# Patient Record
Sex: Female | Born: 1995 | Race: Black or African American | Hispanic: No | Marital: Single | Smoking: Never smoker
Health system: Southern US, Community
[De-identification: ages and names within clinical notes are randomized; demographics above are authoritative.]

## PROBLEM LIST (undated history)

## (undated) ENCOUNTER — Inpatient Hospital Stay (HOSPITAL_COMMUNITY): Payer: Self-pay

## (undated) DIAGNOSIS — Z789 Other specified health status: Secondary | ICD-10-CM

## (undated) HISTORY — DX: Other specified health status: Z78.9

## (undated) HISTORY — PX: NO PAST SURGERIES: SHX2092

---
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
## 2016-06-01 ENCOUNTER — Encounter (HOSPITAL_COMMUNITY): Payer: Self-pay

## 2016-06-01 ENCOUNTER — Ambulatory Visit (HOSPITAL_COMMUNITY)
Admission: EM | Admit: 2016-06-01 | Discharge: 2016-06-01 | Disposition: A | Discharge status: Home / Self Care | Payer: Medicaid Other | Attending: Internal Medicine | Admitting: Internal Medicine

## 2016-06-01 DIAGNOSIS — B373 Candidiasis of vulva and vagina: Secondary | ICD-10-CM | POA: Diagnosis present

## 2016-06-01 DIAGNOSIS — Z888 Allergy status to other drugs, medicaments and biological substances status: Secondary | ICD-10-CM | POA: Insufficient documentation

## 2016-06-01 DIAGNOSIS — B3731 Acute candidiasis of vulva and vagina: Secondary | ICD-10-CM

## 2016-06-01 DIAGNOSIS — Z880 Allergy status to penicillin: Secondary | ICD-10-CM | POA: Insufficient documentation

## 2016-06-01 LAB — POCT URINALYSIS DIP (DEVICE)
BILIRUBIN URINE: NEGATIVE
Glucose, UA: NEGATIVE mg/dL
Hgb urine dipstick: NEGATIVE
Ketones, ur: NEGATIVE mg/dL
Leukocytes, UA: NEGATIVE
NITRITE: NEGATIVE
PH: 6.5 (ref 5.0–8.0)
PROTEIN: NEGATIVE mg/dL
Specific Gravity, Urine: 1.02 (ref 1.005–1.030)
UROBILINOGEN UA: 0.2 mg/dL (ref 0.0–1.0)

## 2016-06-01 MED ORDER — FLUCONAZOLE 150 MG PO TABS: 150.0000 mg | ORAL_TABLET | Freq: Every day | ORAL | 0 refills | Status: DC

## 2016-06-01 NOTE — ED Triage Notes (Signed)
Patient presents with possible yeast infect since 05/27/2016. Patient is experiencing white clumpy discharge and itching.

## 2016-06-01 NOTE — ED Provider Notes (Signed)
CSN: [id number]     Arrival date & time 06/01/16  1540 History   None    Chief Complaint  Patient presents with  . Vaginitis   (Consider location/radiation/quality/duration/timing/severity/associated sxs/prior Treatment) 20 y.o. female presents with vaginal discharge X 3 days. Patient describes it as thik and 'curdlike" with irritation at uvula.Patient is sexually active and states that she hd intercourse on Saturday and her partner used a new brand of condom.  Condition is acute in nature. Condition is made better by nothing. Condition is made worse by nothing. Patient denies any tratment prior to there arrival at this facility. Patient denies any pain with urination, nausea, vomiting or fevers.        History reviewed. No pertinent past medical history. History reviewed. No pertinent surgical history. History reviewed. No pertinent family history. Social History  Substance Use Topics  . Smoking status: Never Smoker  . Smokeless tobacco: Never Used  . Alcohol use No   OB History    No data available     Review of Systems  Allergies  Aspirin; Penicillins; Betadine [povidone iodine]; and Iodine  Home Medications   Prior to Admission medications   Medication Sig Start Date End Date Taking? Authorizing Provider  fluconazole (DIFLUCAN) 150 MG tablet Take 1 tablet (150 mg total) by mouth daily. 06/01/16   Alene MiresJennifer C Amonda Brillhart, NP  metroNIDAZOLE (FLAGYL) 500 MG tablet Take 1 tablet (500 mg total) by mouth 2 (two) times daily. X 7 days 11/24/15   Domenick GongAshley Mortenson, MD   Meds Ordered and Administered this Visit  Medications - No data to display  BP 121/68 (BP Location: Left Arm)   Pulse 76   Temp 98.5 F (36.9 C) (Oral)   Resp 12   LMP 05/09/2016 (Exact Date)   SpO2 100%  No data found.   Physical Exam  Urgent Care Course   Clinical Course    Procedures (including critical care time)  Labs Review Labs Reviewed  POCT URINALYSIS DIP (DEVICE)  URINE CYTOLOGY  ANCILLARY ONLY    Imaging Review No results found.   Visual Acuity Review  Right Eye Distance:   Left Eye Distance:   Bilateral Distance:    Right Eye Near:   Left Eye Near:    Bilateral Near:         MDM   1. Candida vaginitis        Alene MiresJennifer C Clovis Mankins, NP 06/01/16 1636

## 2016-06-03 LAB — URINE CYTOLOGY ANCILLARY ONLY
CHLAMYDIA, DNA PROBE: NEGATIVE
Neisseria Gonorrhea: NEGATIVE
TRICH (WINDOWPATH): NEGATIVE

## 2016-06-05 LAB — URINE CYTOLOGY ANCILLARY ONLY
BACTERIAL VAGINITIS: NEGATIVE
Candida vaginitis: NEGATIVE

## 2016-06-30 ENCOUNTER — Ambulatory Visit (HOSPITAL_COMMUNITY)
Admission: EM | Admit: 2016-06-30 | Discharge: 2016-06-30 | Disposition: A | Discharge status: Home / Self Care | Payer: Medicaid Other | Attending: Family Medicine | Admitting: Family Medicine

## 2016-06-30 ENCOUNTER — Encounter (HOSPITAL_COMMUNITY): Payer: Self-pay | Admitting: *Deleted

## 2016-06-30 DIAGNOSIS — Z880 Allergy status to penicillin: Secondary | ICD-10-CM | POA: Insufficient documentation

## 2016-06-30 DIAGNOSIS — N760 Acute vaginitis: Secondary | ICD-10-CM | POA: Diagnosis not present

## 2016-06-30 DIAGNOSIS — Z79899 Other long term (current) drug therapy: Secondary | ICD-10-CM | POA: Insufficient documentation

## 2016-06-30 DIAGNOSIS — Z202 Contact with and (suspected) exposure to infections with a predominantly sexual mode of transmission: Secondary | ICD-10-CM | POA: Diagnosis not present

## 2016-06-30 LAB — POCT URINALYSIS DIP (DEVICE)
BILIRUBIN URINE: NEGATIVE
GLUCOSE, UA: NEGATIVE mg/dL
Hgb urine dipstick: NEGATIVE
Ketones, ur: 15 mg/dL — AB
LEUKOCYTES UA: NEGATIVE
NITRITE: NEGATIVE
Protein, ur: NEGATIVE mg/dL
Specific Gravity, Urine: 1.02 (ref 1.005–1.030)
Urobilinogen, UA: 0.2 mg/dL (ref 0.0–1.0)
pH: 6.5 (ref 5.0–8.0)

## 2016-06-30 LAB — POCT PREGNANCY, URINE: PREG TEST UR: NEGATIVE

## 2016-06-30 NOTE — ED Triage Notes (Signed)
C/O intermittent abd cramping, appetite changes; pt has implant birth control, but still wishes to have preg test.  Also c/o slight vaginal discharge.  Wishes to be checked for STDs.  No known exposure.

## 2016-06-30 NOTE — Discharge Instructions (Signed)
It was nice seeing you today. We have tested you for STDs. We will contact you with result soon. Call if you have any concern. Please use condom regularly to prevent STDs

## 2016-06-30 NOTE — ED Provider Notes (Signed)
[facility]    CSN: [id number] Arrival date & time: 06/30/16  1545     History   Chief Complaint Chief Complaint  Patient presents with  . Unprotected Sex    HPI Jennifer Sanchez is a 20 y.o. female.   The history is provided by the patient. No language interpreter was used.  Vaginal Discharge  Associated symptoms: no dysuria   She was recently seen here for yeast infection which got treated. She has been sexually active with this new partner with a change in condom. She feels the discharge may be due to the condom but she just wanted to check. Denies dysuria.  History reviewed. No pertinent past medical history.  There are no active problems to display for this patient.   History reviewed. No pertinent surgical history.  OB History    No data available       Home Medications    Prior to Admission medications   Medication Sig Start Date End Date Taking? Authorizing Provider  Etonogestrel (IMPLANON [place]) Inject into the skin.   Yes Historical Provider, MD  fluconazole (DIFLUCAN) 150 MG tablet Take 1 tablet (150 mg total) by mouth daily. 06/01/16   Alene MiresJennifer C Omohundro, NP  metroNIDAZOLE (FLAGYL) 500 MG tablet Take 1 tablet (500 mg total) by mouth 2 (two) times daily. X 7 days 11/24/15   Domenick GongAshley Mortenson, MD    Family History No family history on file.  Social History Social History  Substance Use Topics  . Smoking status: Never Smoker  . Smokeless tobacco: Never Used  . Alcohol use No     Allergies   Aspirin; Penicillins; Betadine [povidone iodine]; and Iodine   Review of Systems Review of Systems  Respiratory: Negative.   Cardiovascular: Negative.   Genitourinary: Positive for vaginal discharge. Negative for difficulty urinating, dysuria, flank pain and menstrual problem.  All other systems reviewed and are negative.    Physical Exam Triage Vital Signs ED Triage Vitals [06/30/16 1640]  Enc Vitals Group     BP 123/76     Pulse Rate  71     Resp 12     Temp 98 F (36.7 C)     Temp Source Oral     SpO2 100 %     Weight      Height      Head Circumference      Peak Flow      Pain Score      Pain Loc      Pain Edu?      Excl. in GC?    No data found.   Updated Vital Signs BP 123/76 (BP Location: Left Arm)   Pulse 71   Temp 98 F (36.7 C) (Oral)   Resp 12   LMP 06/08/2016 (Exact Date)   SpO2 100%   Visual Acuity Right Eye Distance:   Left Eye Distance:   Bilateral Distance:    Right Eye Near:   Left Eye Near:    Bilateral Near:     Physical Exam  Constitutional: She appears well-developed and well-nourished. No distress.  Cardiovascular: Normal rate, regular rhythm and normal heart sounds.  Exam reveals no friction rub.   No murmur heard. Pulmonary/Chest: Effort normal and breath sounds normal. No respiratory distress. She has no wheezes.  Abdominal: Soft. She exhibits no distension. There is no tenderness. There is no guarding.  Genitourinary: Pelvic exam was performed with patient supine. There is no tenderness on the right labia. There is  no tenderness on the left labia. Cervix exhibits no motion tenderness and no friability. Right adnexum displays no mass and no tenderness. Left adnexum displays no mass and no tenderness. Vaginal discharge found.  Nursing note and vitals reviewed.    UC Treatments / Results  Labs (all labs ordered are listed, but only abnormal results are displayed) Labs Reviewed - No data to display  EKG  EKG Interpretation None       Radiology No results found.  Procedures Procedures (including critical care time)  Medications Ordered in UC Medications - No data to display   Initial Impression / Assessment and Plan / UC Course  I have reviewed the triage vital signs and the nursing notes.  Pertinent labs & imaging results that were available during my care of the patient were reviewed by me and considered in my medical decision making (see chart for  details).  Clinical Course as of Jun 30 1718  Wynelle LinkSun Jun 30, 2016  1717 Wet prep and STD testing done today. GU with very minimal discharge which looks normal. Will defer treatment pending test result. STD prevention handout given. Return precaution discussed.  Note Upreg neg today and UA is normal.  [KE]    Clinical Course User Index [KE] Doreene ElandKehinde T Halayna Blane, MD     Final Clinical Impressions(s) / UC Diagnoses   Final diagnoses:  None   Acute vaginitis  Possible exposure to STD  New Prescriptions New Prescriptions   No medications on file     Doreene ElandKehinde T Evonte Prestage, MD 06/30/16 1719

## 2016-07-01 ENCOUNTER — Telehealth: Payer: Self-pay | Admitting: Family Medicine

## 2016-07-01 LAB — CERVICOVAGINAL ANCILLARY ONLY
CHLAMYDIA, DNA PROBE: NEGATIVE
Neisseria Gonorrhea: NEGATIVE

## 2016-07-01 LAB — HIV ANTIBODY (ROUTINE TESTING W REFLEX): HIV SCREEN 4TH GENERATION: NONREACTIVE

## 2016-07-01 LAB — RPR: RPR: NONREACTIVE

## 2016-07-01 NOTE — Telephone Encounter (Signed)
Message left to call about result.  Wet prep still pending.

## 2016-07-02 LAB — CERVICOVAGINAL ANCILLARY ONLY: WET PREP (BD AFFIRM): NEGATIVE

## 2016-09-13 ENCOUNTER — Ambulatory Visit (HOSPITAL_COMMUNITY)
Admission: EM | Admit: 2016-09-13 | Discharge: 2016-09-13 | Disposition: A | Discharge status: Home / Self Care | Payer: Medicaid Other | Attending: Family Medicine | Admitting: Family Medicine

## 2016-09-13 ENCOUNTER — Encounter (HOSPITAL_COMMUNITY): Payer: Self-pay | Admitting: *Deleted

## 2016-09-13 DIAGNOSIS — Z888 Allergy status to other drugs, medicaments and biological substances status: Secondary | ICD-10-CM | POA: Insufficient documentation

## 2016-09-13 DIAGNOSIS — B9689 Other specified bacterial agents as the cause of diseases classified elsewhere: Secondary | ICD-10-CM

## 2016-09-13 DIAGNOSIS — N760 Acute vaginitis: Secondary | ICD-10-CM | POA: Insufficient documentation

## 2016-09-13 DIAGNOSIS — Z880 Allergy status to penicillin: Secondary | ICD-10-CM | POA: Diagnosis not present

## 2016-09-13 DIAGNOSIS — Z79899 Other long term (current) drug therapy: Secondary | ICD-10-CM | POA: Insufficient documentation

## 2016-09-13 DIAGNOSIS — L292 Pruritus vulvae: Secondary | ICD-10-CM | POA: Insufficient documentation

## 2016-09-13 MED ORDER — METRONIDAZOLE 500 MG PO TABS: 500.0000 mg | ORAL_TABLET | Freq: Two times a day (BID) | ORAL | 0 refills | Status: DC

## 2016-09-13 NOTE — Discharge Instructions (Signed)
You are being treated today for bacterial vaginosis. Urine has been collected and will be tested for BV, Yeast, and various other causes of infection. Should any change in therapy be needed, you will be contacted. I have sent a prescription to your pharmacy for Metronidazole. Take 1 tablet twice a day for 7 days. Do not drink alcohol while taking this medicine as it will make you ill. Should your symptoms fail to improve, follow up with your primary care provider or return to clinic.

## 2016-09-13 NOTE — ED Triage Notes (Signed)
PT  REPORTS  VAGINAL ITCHING  WITH  SLIGHT  DISCHARGE  X  3-4   DAYS

## 2016-09-13 NOTE — ED Provider Notes (Signed)
CSN: [id number]     Arrival date & time 09/13/16  1511 History   First MD Initiated Contact with Patient 09/13/16 1633     Chief Complaint  Patient presents with  . Vaginal Itching   (Consider location/radiation/quality/duration/timing/severity/associated sxs/prior Treatment) 21 year old female presents with chief complaint of vaginal discharge and itching. She describes the discharge as white, smooth in texture, and having foul odor. She has prior history of BV and states her current symptoms are consistent with prior infections. She denies being sexually active since her last screening.   The history is provided by the patient.  Vaginal Itching     History reviewed. No pertinent past medical history. History reviewed. No pertinent surgical history. History reviewed. No pertinent family history. Social History  Substance Use Topics  . Smoking status: Never Smoker  . Smokeless tobacco: Never Used  . Alcohol use No   OB History    No data available     Review of Systems  Reason unable to perform ROS: as covered in HPI.  All other systems reviewed and are negative.   Allergies  Aspirin; Penicillins; Betadine [povidone iodine]; and Iodine  Home Medications   Prior to Admission medications   Medication Sig Start Date End Date Taking? Authorizing Provider  Etonogestrel (IMPLANON ) Inject into the skin.    Historical Provider, MD  fluconazole (DIFLUCAN) 150 MG tablet Take 1 tablet (150 mg total) by mouth daily. 06/01/16   Alene MiresJennifer C Omohundro, NP  metroNIDAZOLE (FLAGYL) 500 MG tablet Take 1 tablet (500 mg total) by mouth 2 (two) times daily. X 7 days 11/24/15   Domenick GongAshley Mortenson, MD  metroNIDAZOLE (FLAGYL) 500 MG tablet Take 1 tablet (500 mg total) by mouth 2 (two) times daily. 09/13/16   Dorena BodoLawrence Leea Rambeau, NP   Meds Ordered and Administered this Visit  Medications - No data to display  LMP 09/08/2016  No data found.   Physical Exam  Constitutional: She is oriented to  person, place, and time. She appears well-developed and well-nourished. No distress.  HENT:  Head: Normocephalic and atraumatic.  Genitourinary:  Genitourinary Comments: Deferred   Neurological: She is alert and oriented to person, place, and time.  Skin: Skin is warm and dry. Capillary refill takes less than 2 seconds. She is not diaphoretic.  Psychiatric: She has a normal mood and affect.  Vitals reviewed.   Urgent Care Course     Procedures (including critical care time)  Labs Review Labs Reviewed  URINE CYTOLOGY ANCILLARY ONLY    Imaging Review No results found.   Visual Acuity Review  Right Eye Distance:   Left Eye Distance:   Bilateral Distance:    Right Eye Near:   Left Eye Near:    Bilateral Near:         MDM   1. BV (bacterial vaginosis)   You are being treated today for bacterial vaginosis. Urine has been collected and will be tested for BV, Yeast, and various other causes of infection. Should any change in therapy be needed, you will be contacted. I have sent a prescription to your pharmacy for Metronidazole. Take 1 tablet twice a day for 7 days. Do not drink alcohol while taking this medicine as it will make you ill. Should your symptoms fail to improve, follow up with your primary care provider or return to clinic.      Dorena BodoLawrence Yani Lal, NP 09/13/16 1646

## 2016-09-17 LAB — URINE CYTOLOGY ANCILLARY ONLY
Chlamydia: NEGATIVE
NEISSERIA GONORRHEA: NEGATIVE
TRICH (WINDOWPATH): NEGATIVE

## 2016-09-19 LAB — URINE CYTOLOGY ANCILLARY ONLY: Candida vaginitis: NEGATIVE

## 2016-09-24 ENCOUNTER — Telehealth (HOSPITAL_COMMUNITY): Payer: Self-pay | Admitting: Emergency Medicine

## 2016-09-24 MED ORDER — FLUCONAZOLE 150 MG PO TABS: 150.0000 mg | ORAL_TABLET | Freq: Every day | ORAL | 0 refills | Status: DC

## 2016-09-24 NOTE — Telephone Encounter (Signed)
Pt called needing Rx for Diflucan after taking Flagyl  Updated new phone # in EPIC  Per Harriet PhoLawrence K, NP... Ok to call in Diflucan 150 mg and repeat in 72 hours if no improvement.   Per pt's request... [facility]  Also gave results from 09/13/16 (Chlam/Gon/Trich/BV)  Pt verb understanding.

## 2018-01-21 ENCOUNTER — Emergency Department (HOSPITAL_COMMUNITY): Payer: Self-pay

## 2018-01-21 ENCOUNTER — Emergency Department (HOSPITAL_COMMUNITY)
Admission: EM | Admit: 2018-01-21 | Discharge: 2018-01-21 | Disposition: A | Discharge status: Home / Self Care | Payer: Self-pay | Attending: Emergency Medicine | Admitting: Emergency Medicine

## 2018-01-21 ENCOUNTER — Encounter (HOSPITAL_COMMUNITY): Payer: Self-pay | Admitting: Emergency Medicine

## 2018-01-21 DIAGNOSIS — R0789 Other chest pain: Secondary | ICD-10-CM

## 2018-01-21 DIAGNOSIS — J069 Acute upper respiratory infection, unspecified: Secondary | ICD-10-CM | POA: Insufficient documentation

## 2018-01-21 DIAGNOSIS — B9789 Other viral agents as the cause of diseases classified elsewhere: Secondary | ICD-10-CM

## 2018-01-21 DIAGNOSIS — Z79899 Other long term (current) drug therapy: Secondary | ICD-10-CM | POA: Insufficient documentation

## 2018-01-21 LAB — I-STAT BETA HCG BLOOD, ED (MC, WL, AP ONLY): I-stat hCG, quantitative: <5 m[IU]/mL (ref <5)

## 2018-01-21 LAB — CBC
HEMATOCRIT: 40.4 % (ref 36.0–46.0)
HEMOGLOBIN: 13.1 g/dL (ref 12.0–15.0)
MCH: 31 pg (ref 26.0–34.0)
MCHC: 32.4 g/dL (ref 30.0–36.0)
MCV: 95.7 fL (ref 78.0–100.0)
Platelets: 280 10*3/uL (ref 150–400)
RBC: 4.22 MIL/uL (ref 3.87–5.11)
RDW: 12.4 % (ref 11.5–15.5)
WBC: 5.7 10*3/uL (ref 4.0–10.5)

## 2018-01-21 LAB — I-STAT TROPONIN, ED: Troponin i, poc: 0 ng/mL (ref 0.00–0.08)

## 2018-01-21 LAB — BASIC METABOLIC PANEL
ANION GAP: 8 (ref 5–15)
BUN: 12 mg/dL (ref 6–20)
CHLORIDE: 107 mmol/L (ref 101–111)
CO2: 25 mmol/L (ref 22–32)
Calcium: 8.8 mg/dL — ABNORMAL LOW (ref 8.9–10.3)
Creatinine, Ser: 0.7 mg/dL (ref 0.44–1.00)
GFR calc Af Amer: >60 mL/min (ref >60)
GLUCOSE: 127 mg/dL — AB (ref 65–99)
POTASSIUM: 3.6 mmol/L (ref 3.5–5.1)
Sodium: 140 mmol/L (ref 135–145)

## 2018-01-21 MED ORDER — GUAIFENESIN-CODEINE 100-10 MG/5ML PO SOLN: 10.0000 mL | Freq: Four times a day (QID) | ORAL | 0 refills | Status: DC | PRN

## 2018-01-21 NOTE — Discharge Instructions (Addendum)
Ibuprofen 600 mg every 6 hours as needed for pain.  Robitussin with codeine as prescribed as needed for cough.  Follow-up with your primary doctor if symptoms are not improving in the next week.

## 2018-01-21 NOTE — ED Triage Notes (Signed)
Patient presents with multiple complaints : central chest pain with dry cough , nasal congestion/sinus pressure and left temporal headache .

## 2018-01-21 NOTE — ED Provider Notes (Signed)
[facility] EMERGENCY DEPARTMENT Provider Note   CSN: [id number] Arrival date & time: 01/21/18  0007     History   Chief Complaint Chief Complaint  Patient presents with  . Chest Pain    HPI Jennifer Sanchez is a 22 y.o. female.  Patient is a 22 year old female with no significant past medical history.  She presents with a 3-day history of nasal congestion, cough, and chest discomfort.  Her cough is been nonproductive.  She denies any fevers or chills.  She denies any ill contacts.  The history is provided by the patient.  Chest Pain   This is a new problem. Episode onset: 3 days ago. The problem occurs constantly. The problem has been gradually worsening. The pain is associated with coughing. The pain is present in the substernal region. The pain is moderate. The quality of the pain is described as sharp. The pain does not radiate.    History reviewed. No pertinent past medical history.  There are no active problems to display for this patient.   History reviewed. No pertinent surgical history.   OB History   None      Home Medications    Prior to Admission medications   Medication Sig Start Date End Date Taking? Authorizing Provider  norgestimate-ethinyl estradiol (ORTHO-CYCLEN,SPRINTEC,PREVIFEM) 0.25-35 MG-MCG tablet Take 1 tablet by mouth daily.   Yes [provider]  fluconazole (DIFLUCAN) 150 MG tablet Take 1 tablet (150 mg total) by mouth daily. Patient not taking: Reported on 01/21/2018 06/01/16   Alene Mires, NP  fluconazole (DIFLUCAN) 150 MG tablet Take 1 tablet (150 mg total) by mouth daily. Repeat in 72 hours if not improving. Patient not taking: Reported on 01/21/2018 09/24/16   Dorena Bodo, NP  metroNIDAZOLE (FLAGYL) 500 MG tablet Take 1 tablet (500 mg total) by mouth 2 (two) times daily. X 7 days Patient not taking: Reported on 01/21/2018 11/24/15   Domenick Gong, MD  metroNIDAZOLE (FLAGYL) 500 MG tablet Take 1  tablet (500 mg total) by mouth 2 (two) times daily. Patient not taking: Reported on 01/21/2018 09/13/16   Dorena Bodo, NP    Family History No family history on file.  Social History Social History   Tobacco Use  . Smoking status: Never Smoker  . Smokeless tobacco: Never Used  Substance Use Topics  . Alcohol use: No  . Drug use: No     Allergies   Aspirin; Penicillins; Betadine [povidone iodine]; and Iodine   Review of Systems Review of Systems  Cardiovascular: Positive for chest pain.  All other systems reviewed and are negative.    Physical Exam Updated Vital Signs BP 116/80 (BP Location: Left Arm)   Pulse 74   Temp 98.2 F (36.8 C) (Oral)   Resp 18   Ht 5\' 7"  (1.702 m)   Wt 68.5 kg (151 lb)   LMP 01/14/2018 (Approximate)   SpO2 100%   BMI 23.65 kg/m   Physical Exam  Constitutional: She is oriented to person, place, and time. She appears well-developed and well-nourished. No distress.  HENT:  Head: Normocephalic and atraumatic.  Neck: Normal range of motion. Neck supple.  Cardiovascular: Normal rate and regular rhythm. Exam reveals no gallop and no friction rub.  No murmur heard. Pulmonary/Chest: Effort normal and breath sounds normal. No respiratory distress. She has no wheezes.  Abdominal: Soft. Bowel sounds are normal. She exhibits no distension. There is no tenderness.  Musculoskeletal: Normal range of motion.  Neurological: She is alert and  oriented to person, place, and time.  Skin: Skin is warm and dry. She is not diaphoretic.  Nursing note and vitals reviewed.    ED Treatments / Results  Labs (all labs ordered are listed, but only abnormal results are displayed) Labs Reviewed  BASIC METABOLIC PANEL - Abnormal; Notable for the following components:      Result Value   Glucose, Bld 127 (*)    Calcium 8.8 (*)    All other components within normal limits  CBC  I-STAT TROPONIN, ED  I-STAT BETA HCG BLOOD, ED ([facility], [facility], [facility] ONLY)     EKG EKG Interpretation  Date/Time:  Wednesday January 21 2018 00:27:41 EDT Ventricular Rate:  90 PR Interval:  152 QRS Duration: 82 QT Interval:  338 QTC Calculation: 413 R Axis:   77 Text Interpretation:  Normal sinus rhythm with sinus arrhythmia Otherwise within normal limits No old tracing to compare Confirmed by Dione BoozeGlick, David ([id number]) on 01/21/2018 12:32:24 AM   Radiology Dg Chest 2 View  Result Date: 01/21/2018 CLINICAL DATA:  Acute onset of mid chest pain. EXAM: CHEST - 2 VIEW COMPARISON:  None. FINDINGS: The lungs are well-aerated and clear. There is no evidence of focal opacification, pleural effusion or pneumothorax. The heart is normal in size; the mediastinal contour is within normal limits. No acute osseous abnormalities are seen. Bilateral metallic nipple piercings are noted. IMPRESSION: No acute cardiopulmonary process seen. Electronically Signed   By: Roanna RaiderJeffery  Chang M.D.   On: 01/21/2018 01:14    Procedures Procedures (including critical care time)  Medications Ordered in ED Medications - No data to display   Initial Impression / Assessment and Plan / ED Course  I have reviewed the triage vital signs and the nursing notes.  Pertinent labs & imaging results that were available during my care of the patient were reviewed by me and considered in my medical decision making (see chart for details).  Patient symptoms are most likely viral in nature.  She is having some chest wall discomfort, likely from coughing.  Nothing in the work-up suggest a cardiac etiology and I highly doubt PE.  I will recommend over-the-counter anti-inflammatory medicines and prescribe Robitussin with codeine which she can take as needed for her cough.  Final Clinical Impressions(s) / ED Diagnoses   Final diagnoses:  None    ED Discharge Orders    None       Geoffery Lyonselo, Anja Neuzil, MD 01/21/18 [phone]

## 2018-06-16 ENCOUNTER — Emergency Department (HOSPITAL_COMMUNITY)
Admission: EM | Admit: 2018-06-16 | Discharge: 2018-06-16 | Disposition: A | Discharge status: Home / Self Care | Payer: Medicaid Other | Attending: Emergency Medicine | Admitting: Emergency Medicine

## 2018-06-16 ENCOUNTER — Other Ambulatory Visit: Payer: Self-pay

## 2018-06-16 ENCOUNTER — Emergency Department (HOSPITAL_COMMUNITY): Payer: Medicaid Other

## 2018-06-16 ENCOUNTER — Encounter (HOSPITAL_COMMUNITY): Payer: Self-pay | Admitting: Emergency Medicine

## 2018-06-16 DIAGNOSIS — Z3A01 Less than 8 weeks gestation of pregnancy: Secondary | ICD-10-CM | POA: Insufficient documentation

## 2018-06-16 DIAGNOSIS — O209 Hemorrhage in early pregnancy, unspecified: Secondary | ICD-10-CM | POA: Diagnosis not present

## 2018-06-16 DIAGNOSIS — O9989 Other specified diseases and conditions complicating pregnancy, childbirth and the puerperium: Secondary | ICD-10-CM | POA: Insufficient documentation

## 2018-06-16 DIAGNOSIS — O4690 Antepartum hemorrhage, unspecified, unspecified trimester: Secondary | ICD-10-CM

## 2018-06-16 DIAGNOSIS — R102 Pelvic and perineal pain: Secondary | ICD-10-CM | POA: Diagnosis not present

## 2018-06-16 LAB — CBC WITH DIFFERENTIAL/PLATELET
Abs Immature Granulocytes: 0.03 10*3/uL (ref 0.00–0.07)
Basophils Absolute: 0 10*3/uL (ref 0.0–0.1)
Basophils Relative: 0 %
Eosinophils Absolute: 0.1 10*3/uL (ref 0.0–0.5)
Eosinophils Relative: 2 %
HCT: 36.7 % (ref 36.0–46.0)
Hemoglobin: 11.9 g/dL — ABNORMAL LOW (ref 12.0–15.0)
Immature Granulocytes: 0 %
Lymphocytes Relative: 31 %
Lymphs Abs: 2.3 10*3/uL (ref 0.7–4.0)
MCH: 30.9 pg (ref 26.0–34.0)
MCHC: 32.4 g/dL (ref 30.0–36.0)
MCV: 95.3 fL (ref 80.0–100.0)
Monocytes Absolute: 0.6 10*3/uL (ref 0.1–1.0)
Monocytes Relative: 8 %
Neutro Abs: 4.3 10*3/uL (ref 1.7–7.7)
Neutrophils Relative %: 59 %
Platelets: 325 10*3/uL (ref 150–400)
RBC: 3.85 MIL/uL — ABNORMAL LOW (ref 3.87–5.11)
RDW: 12.5 % (ref 11.5–15.5)
WBC: 7.4 10*3/uL (ref 4.0–10.5)
nRBC: 0 % (ref 0.0–0.2)

## 2018-06-16 LAB — HCG, QUANTITATIVE, PREGNANCY: hCG, Beta Chain, Quant, S: 126030 m[IU]/mL — ABNORMAL HIGH (ref <5)

## 2018-06-16 LAB — BASIC METABOLIC PANEL
Anion gap: 11 (ref 5–15)
BUN: 8 mg/dL (ref 6–20)
CO2: 22 mmol/L (ref 22–32)
Calcium: 9 mg/dL (ref 8.9–10.3)
Chloride: 102 mmol/L (ref 98–111)
Creatinine, Ser: 0.64 mg/dL (ref 0.44–1.00)
GFR calc Af Amer: >60 mL/min (ref >60)
GFR calc non Af Amer: >60 mL/min (ref >60)
Glucose, Bld: 94 mg/dL (ref 70–99)
Potassium: 3.3 mmol/L — ABNORMAL LOW (ref 3.5–5.1)
Sodium: 135 mmol/L (ref 135–145)

## 2018-06-16 LAB — I-STAT BETA HCG BLOOD, ED (MC, WL, AP ONLY): I-stat hCG, quantitative: >2000 m[IU]/mL — ABNORMAL HIGH (ref <5)

## 2018-06-16 LAB — ABO/RH: ABO/RH(D): B POS

## 2018-06-16 LAB — WET PREP, GENITAL
Sperm: NONE SEEN
Trich, Wet Prep: NONE SEEN
Yeast Wet Prep HPF POC: NONE SEEN

## 2018-06-16 NOTE — ED Notes (Signed)
Results reviewed, no changes to acuity at this time 

## 2018-06-16 NOTE — ED Triage Notes (Signed)
Pt reports pelvic pain and vaginal bleeding/clot that started on Sunday. Pt reports his has since gone away with brown spotting this morning. Pain currently 2/10. Denies N/V/D. Pt reports her period was late, was supposed to be on 10/13 and only lasted these past 2 days. Pt takes birth control.

## 2018-06-16 NOTE — ED Notes (Signed)
Patient transported to [facility] 

## 2018-06-16 NOTE — Discharge Instructions (Addendum)
Please read the attached information's.  Please start taking prenatal vitamin with folic acid.  Please contact the women's outpatient clinic to schedule follow-up evaluation.  Return immediately if develop any new or concerning signs or symptoms.

## 2018-06-16 NOTE — ED Notes (Signed)
Pt returned from US

## 2018-06-16 NOTE — ED Provider Notes (Signed)
[name] EMERGENCY DEPARTMENT Provider Note   CSN: [id number] Arrival date & time: 06/16/18  1636     History   Chief Complaint Chief Complaint  Patient presents with  . Pelvic Pain  . Vaginal Bleeding    HPI Jennifer Sanchez is a 22 y.o. female.  HPI   22 year old G1, P0 A0 female presents today with complaints of vaginal bleeding and pelvic pain.  Patient notes that she is currently taking Sprintec for birth control.  She notes she has not missed any of her regularly scheduled medications over the last several months, she notes that her menstrual cycle is generally on the same time each month, her last menstrual cycle was on September 13 and she was expecting another one on October 13.  Patient notes that 2 days ago on October 27 she started having heavy bleeding as if she was going to have her menstrual cycle, this became very light the following day and now is having just a small amount of brown discharge.  Patient notes a very slight pain in her mid suprapubic region, nonsevere, she denies any abdominal pain nausea or vomiting.  Patient notes that after missing her period on the 13th she stopped taking her birth control pills.    History reviewed. No pertinent past medical history.  There are no active problems to display for this patient.   History reviewed. No pertinent surgical history.   OB History   None      Home Medications    Prior to Admission medications   Medication Sig Start Date End Date Taking? Authorizing Provider  fluconazole (DIFLUCAN) 150 MG tablet Take 1 tablet (150 mg total) by mouth daily. Patient not taking: Reported on 01/21/2018 06/01/16   Alene Mires, NP  fluconazole (DIFLUCAN) 150 MG tablet Take 1 tablet (150 mg total) by mouth daily. Repeat in 72 hours if not improving. Patient not taking: Reported on 01/21/2018 09/24/16   Dorena Bodo, NP  guaiFENesin-codeine 100-10 MG/5ML syrup Take 10 mLs by mouth every  6 (six) hours as needed for cough. 01/21/18   Geoffery Lyons, MD  metroNIDAZOLE (FLAGYL) 500 MG tablet Take 1 tablet (500 mg total) by mouth 2 (two) times daily. X 7 days Patient not taking: Reported on 01/21/2018 11/24/15   Domenick Gong, MD  metroNIDAZOLE (FLAGYL) 500 MG tablet Take 1 tablet (500 mg total) by mouth 2 (two) times daily. Patient not taking: Reported on 01/21/2018 09/13/16   Dorena Bodo, NP  norgestimate-ethinyl estradiol (ORTHO-CYCLEN,SPRINTEC,PREVIFEM) 0.25-35 MG-MCG tablet Take 1 tablet by mouth daily.    [provider]    Family History No family history on file.  Social History Social History   Tobacco Use  . Smoking status: Never Smoker  . Smokeless tobacco: Never Used  Substance Use Topics  . Alcohol use: No  . Drug use: No     Allergies   Aspirin; Penicillins; Betadine [povidone iodine]; Iodine; and Shellfish allergy   Review of Systems Review of Systems  All other systems reviewed and are negative.    Physical Exam Updated Vital Signs BP 124/73 (BP Location: Right Arm)   Pulse 87   Temp 99 F (37.2 C) (Oral)   Resp 16   Ht 5\' 6"  (1.676 m)   Wt 70.3 kg   LMP 06/16/2018   SpO2 100%   BMI 25.02 kg/m   Physical Exam  Constitutional: She is oriented to person, place, and time. She appears well-developed and well-nourished.  HENT:  Head:  Normocephalic and atraumatic.  Eyes: Pupils are equal, round, and reactive to light. Conjunctivae are normal. Right eye exhibits no discharge. Left eye exhibits no discharge. No scleral icterus.  Neck: Normal range of motion. No JVD present. No tracheal deviation present.  Pulmonary/Chest: Effort normal. No stridor.  Abdominal: Soft. She exhibits no distension. There is no tenderness.  Genitourinary:  Genitourinary Comments: Cervical loss is closed, no blood noted in the vaginal vault, slight irritation on the inferior cervix-no cervical motion tenderness  Neurological: She is alert and oriented to  person, place, and time. Coordination normal.  Psychiatric: She has a normal mood and affect. Her behavior is normal. Judgment and thought content normal.  Nursing note and vitals reviewed.    ED Treatments / Results  Labs (all labs ordered are listed, but only abnormal results are displayed) Labs Reviewed  WET PREP, GENITAL - Abnormal; Notable for the following components:      Result Value   Clue Cells Wet Prep HPF POC PRESENT (*)    WBC, Wet Prep HPF POC MANY (*)    All other components within normal limits  CBC WITH DIFFERENTIAL/PLATELET - Abnormal; Notable for the following components:   RBC 3.85 (*)    Hemoglobin 11.9 (*)    All other components within normal limits  BASIC METABOLIC PANEL - Abnormal; Notable for the following components:   Potassium 3.3 (*)    All other components within normal limits  HCG, QUANTITATIVE, PREGNANCY - Abnormal; Notable for the following components:   hCG, Beta Chain, Quant, S 126,030 (*)    All other components within normal limits  I-STAT BETA HCG BLOOD, ED ([facility], [facility], [facility] ONLY) - Abnormal; Notable for the following components:   I-stat hCG, quantitative >2,000.0 (*)    All other components within normal limits  ABO/RH  GC/CHLAMYDIA PROBE AMP ([place]) NOT AT [facility]    EKG None  Radiology US Ob Comp < 14 Wks  Result Date: 06/16/2018 CLINICAL DATA:  Vaginal bleeding. Estimated gestational age per LMP 6 weeks 4 days. Quantitative HCG greater than 2000. EXAM: OBSTETRIC <14 WK [place] AND TRANSVAGINAL OB US TECHNIQUE: Both transabdominal and transvaginal ultrasound examinations were performed for complete evaluation of the gestation as well as the maternal uterus, adnexal regions, and pelvic cul-de-sac. Transvaginal technique was performed to assess early pregnancy. COMPARISON:  None. FINDINGS: Intrauterine gestational sac: Single visualized. Single thin septation noted within the gestational sac. Yolk sac:  Visualized. Embryo:  Visualized. Cardiac  Activity: Visualized. Heart Rate: 119 bpm CRL: 6.2 mm   6 w   3 d                  [place] EDC: 02/06/2019 Subchorionic hemorrhage:  None visualized. Maternal uterus/adnexae: Ovaries are normal size, shape and position with normal color Doppler. No free pelvic fluid. IMPRESSION: Single live IUP with estimated gestational age [redacted] weeks 3 days. Electronically Signed   By: Elberta Fortis M.D.   On: 06/16/2018 21:44   US Ob Transvaginal  Result Date: 06/16/2018 CLINICAL DATA:  Vaginal bleeding. Estimated gestational age per LMP 6 weeks 4 days. Quantitative HCG greater than 2000. EXAM: OBSTETRIC <14 WK [place] AND TRANSVAGINAL OB US TECHNIQUE: Both transabdominal and transvaginal ultrasound examinations were performed for complete evaluation of the gestation as well as the maternal uterus, adnexal regions, and pelvic cul-de-sac. Transvaginal technique was performed to assess early pregnancy. COMPARISON:  None. FINDINGS: Intrauterine gestational sac: Single visualized. Single thin septation noted within the gestational sac. Yolk  sac:  Visualized. Embryo:  Visualized. Cardiac Activity: Visualized. Heart Rate: 119 bpm CRL: 6.2 mm   6 w   3 d                  [place] EDC: 02/06/2019 Subchorionic hemorrhage:  None visualized. Maternal uterus/adnexae: Ovaries are normal size, shape and position with normal color Doppler. No free pelvic fluid. IMPRESSION: Single live IUP with estimated gestational age [redacted] weeks 3 days. Electronically Signed   By: Elberta Fortis M.D.   On: 06/16/2018 21:44    Procedures Procedures (including critical care time)  Medications Ordered in ED Medications - No data to display   Initial Impression / Assessment and Plan / ED Course  I have reviewed the triage vital signs and the nursing notes.  Pertinent labs & imaging results that were available during my care of the patient were reviewed by me and considered in my medical decision making (see chart for details).     Labs: CBC, BMP, hCG, ABO/RH, WET  prep  Imaging: OB ultrasound  Consults:  Therapeutics:  Discharge Meds:   Assessment/Plan: 22 year old female presents today with vaginal bleeding and pregnancy.  Ultrasound is reassuring with intrauterine pregnancy with no comp gating features.  She is B+ not a RhoGam candidate.  And has iodine allergies, she will start taking prenatals at home, follow[facility]up at the [facility] for repeat evaluation and ongoing management.  Return immediately if she develops any new or worsening signs or symptoms.  Patient does have clue cells on wet prep no vaginal discharge, asymptomatic will defer treatment to OB if necessary, based on current guidelines no treatment indicated at this time.  Patient verbalized understanding and agreement to today's plan had no further questions or concerns.    Final Clinical Impressions(s) / ED Diagnoses   Final diagnoses:  Vaginal bleeding in pregnancy    ED Discharge Orders    None       Rosalio Loud 06/16/18 2213    Tegeler, Canary Brim, MD 06/16/18 2306

## 2018-06-17 LAB — GC/CHLAMYDIA PROBE AMP (~~LOC~~) NOT AT ARMC
Chlamydia: NEGATIVE
Neisseria Gonorrhea: NEGATIVE

## 2018-07-10 ENCOUNTER — Ambulatory Visit (INDEPENDENT_AMBULATORY_CARE_PROVIDER_SITE_OTHER): Payer: Medicaid Other | Admitting: Internal Medicine

## 2018-07-10 ENCOUNTER — Ambulatory Visit: Payer: Medicaid Other | Admitting: Clinical

## 2018-07-10 ENCOUNTER — Encounter: Payer: Self-pay | Admitting: Family Medicine

## 2018-07-10 VITALS — BP 115/66 | HR 88 | Wt 165.4 lb

## 2018-07-10 DIAGNOSIS — Z3490 Encounter for supervision of normal pregnancy, unspecified, unspecified trimester: Secondary | ICD-10-CM | POA: Insufficient documentation

## 2018-07-10 DIAGNOSIS — Z3400 Encounter for supervision of normal first pregnancy, unspecified trimester: Secondary | ICD-10-CM

## 2018-07-10 DIAGNOSIS — Z3401 Encounter for supervision of normal first pregnancy, first trimester: Secondary | ICD-10-CM

## 2018-07-10 NOTE — Progress Notes (Signed)
Pt presents for New Ob intake.  New Ob packet given, Medicaid Home form completed. Unable to hear FHR with doppler therefore FHR obtained with bedside US per M-mode. Pt expressed concern for 15 lb weight gain thus far in pregnancy. She was advised to discuss with provider on 11/27. Pt voiced understanding of all information given. She will return as scheduled for New Ob provider visit on 11/27.

## 2018-07-10 NOTE — BH Specialist Note (Signed)
Integrated Behavioral Health Initial Visit  MRN: [id number] Name: Jennifer Sanchez  Number of Integrated Behavioral Health Clinician visits:: 1/6 Session Start time: 11:50  Session End time: 12:00 Total time: 15 minutes  Type of Service: Integrated Behavioral Health- Individual/Family Interpretor:No. Interpretor Name and Language: n/a   Warm Hand Off Completed.       SUBJECTIVE: Jennifer Sanchez is a 22 y.o. female accompanied by n/a Patient was referred by [place] Mutaiane Day, RN for Initial OB introduction to integrated behavioral health services . Patient reports the following symptoms/concerns: Pt states no particular concerns today.  Duration of problem: n/a; Severity of problem: n/a  OBJECTIVE: Mood: Normal and Affect: Appropriate Risk of harm to self or others: No plan to harm self or others  LIFE CONTEXT: Family and Social: - School/Work: Pt works full-time Self-Care: - Life Changes: Current pregnancy  GOALS ADDRESSED: Patient will: 1. Increase knowledge and/or ability of: healthy habits  2.  INTERVENTIONS:  Standardized Assessments completed: GAD-7 and PHQ 9  ASSESSMENT: Patient currently experiencing Supervision of low-risk first pregnancy, antepartum   Patient may benefit from Initial OB introduction to integrated behavioral health services .  PLAN: 1. Follow up with behavioral health clinician on : As needed 2. Behavioral recommendations:  -Continue taking prenatal vitamin daily, as recommended by medical provider 3. Referral(s): [facility] 4. "From scale of 1-10, how likely are you to follow plan?": 10  Rae LipsJamie C Desha Bitner, LCSW  Depression screen [facility] 07/10/2018  Decreased Interest 0  Down, Depressed, Hopeless 0  PHQ - 2 Score 0  Altered sleeping 1  Tired, decreased energy 1  Change in appetite 0  Feeling bad or failure about yourself  0  Trouble concentrating 0  Moving slowly or fidgety/restless 0   Suicidal thoughts 0  PHQ-9 Score 2   GAD 7 : Generalized Anxiety Score 07/10/2018  Nervous, Anxious, on Edge 0  Control/stop worrying 1  Worry too much - different things 1  Trouble relaxing 0  Restless 0  Easily annoyed or irritable 1  Afraid - awful might happen 0  Total GAD 7 Score 3

## 2018-07-11 NOTE — Progress Notes (Signed)
Chart reviewed for nurse visit. Agree with plan of care.   Arvilla MarketWallace, Quandarius Nill Lauren, DO 07/11/2018 10:25 AM

## 2018-07-15 ENCOUNTER — Encounter: Payer: Self-pay | Admitting: Internal Medicine

## 2018-07-15 ENCOUNTER — Ambulatory Visit (INDEPENDENT_AMBULATORY_CARE_PROVIDER_SITE_OTHER): Payer: Medicaid Other | Admitting: Internal Medicine

## 2018-07-15 ENCOUNTER — Other Ambulatory Visit (HOSPITAL_COMMUNITY)
Admission: RE | Admit: 2018-07-15 | Discharge: 2018-07-15 | Disposition: A | Discharge status: Home / Self Care | Payer: Medicaid Other | Source: Ambulatory Visit | Attending: Internal Medicine | Admitting: Internal Medicine

## 2018-07-15 VITALS — BP 114/69 | HR 88 | Wt 165.5 lb

## 2018-07-15 DIAGNOSIS — Z3400 Encounter for supervision of normal first pregnancy, unspecified trimester: Secondary | ICD-10-CM | POA: Insufficient documentation

## 2018-07-15 DIAGNOSIS — Z3401 Encounter for supervision of normal first pregnancy, first trimester: Secondary | ICD-10-CM | POA: Diagnosis not present

## 2018-07-15 LAB — CULTURE, OB URINE

## 2018-07-15 LAB — URINE CULTURE, OB REFLEX

## 2018-07-15 NOTE — Progress Notes (Addendum)
Anatomy US scheduled for January 24th @ 1045.  Pt notified.  [facility] Com[facility]

## 2018-07-15 NOTE — Progress Notes (Signed)
Subjective:   Jennifer Sanchez is a 22 y.o. G1P0 at [redacted]w[redacted]d by early ultrasound at [redacted]wk GA being seen today for her first obstetrical visit.  Her obstetrical history is significant for rapid weight gain during first trimester. Patient does intend to breast feed. Pregnancy history fully reviewed.  Patient reports no complaints.  HISTORY: OB History  Gravida Para Term Preterm AB Living  1 0 0 0 0 0  SAB TAB Ectopic Multiple Live Births  0 0 0 0 0    # Outcome Date GA Lbr Len/2nd Weight Sex Delivery Anes PTL Lv  1 Current            Past Medical History:  Diagnosis Date  . Medical history non-contributory    Past Surgical History:  Procedure Laterality Date  . WISDOM TOOTH EXTRACTION  2015   Family History  Problem Relation Age of Onset  . Lupus Mother   . Atrial fibrillation Mother   . Endometriosis Sister   . Hypertension Sister    Social History   Tobacco Use  . Smoking status: Never Smoker  . Smokeless tobacco: Never Used  Substance Use Topics  . Alcohol use: No  . Drug use: No   Allergies  Allergen Reactions  . Aspirin Shortness Of Breath  . Penicillins Anaphylaxis  . Betadine [Povidone Iodine] Hives  . Iodine Hives  . Shellfish Allergy     hives   Current Outpatient Medications on File Prior to Visit  Medication Sig Dispense Refill  . Prenatal Vit-Fe Fumarate-FA (MULTIVITAMIN-PRENATAL) 27-0.8 MG TABS tablet Take 1 tablet by mouth daily at 12 noon.     No current facility-administered medications on file prior to visit.     Indications for ASA therapy (per uptodate) One of the following: Previous pregnancy with preeclampsia, especially early onset and with an adverse outcome No Multifetal gestation No Chronic hypertension No Type 1 or 2 diabetes mellitus No Chronic kidney disease No Autoimmune disease (antiphospholipid syndrome, systemic lupus erythematosus) No  Two or more of the following: Nulliparity Yes Obesity (body mass index >30  kg/m2) No Family history of preeclampsia in mother or sister No Age ?35 years No Sociodemographic characteristics (African American race, low socioeconomic level) Yes Personal risk factors (eg, previous pregnancy with low birth weight or small for gestational age infant, previous adverse pregnancy outcome [eg, stillbirth], interval >10 years between pregnancies) No  Indications for early 1 hour GTT (per uptodate)  BMI >25 (>23 in Asian women) AND one of the following  Gestational diabetes mellitus in a previous pregnancy No Glycated hemoglobin ?5.7 percent (39 mmol/mol), impaired glucose tolerance, or impaired fasting glucose on previous testing No First-degree relative with diabetes No High-risk race/ethnicity (eg, African American, Latino, Native American, [place] American, Pacific Islander) Yes History of cardiovascular disease No Hypertension or on therapy for hypertension No High-density lipoprotein cholesterol level <35 mg/dL (1.61 mmol/L) and/or a triglyceride level >250 mg/dL (0.96 mmol/L) No Polycystic ovary syndrome No Physical inactivity No Other clinical condition associated with insulin resistance (eg, severe obesity, acanthosis nigricans) No Previous birth of an infant weighing ?4000 g No Previous stillbirth of unknown cause No   Exam   Vitals:   07/15/18 1348  BP: 114/69  Pulse: 88  Weight: 165 lb 8 oz (75.1 kg)   Fetal Heart Rate (bpm): 166  Uterus:     Pelvic Exam: Perineum: no hemorrhoids, normal perineum   Vulva: normal external genitalia, no lesions   Vagina:  normal mucosa, normal discharge  Cervix: no lesions and normal, pap smear done.    Adnexa: normal adnexa and no mass, fullness, tenderness   Bony Pelvis: average  System: General: well-developed, well-nourished female in no acute distress   Breast:  normal appearance, no masses or tenderness   Skin: normal coloration and turgor, no rashes   Neurologic: oriented, normal, negative, normal mood    Extremities: normal strength, tone, and muscle mass, ROM of all joints is normal   HEENT PERRLA, extraocular movement intact and sclera clear, anicteric   Mouth/Teeth mucous membranes moist, pharynx normal without lesions and dental hygiene good   Neck supple and no masses   Cardiovascular: regular rate and rhythm   Respiratory:  no respiratory distress, normal breath sounds   Abdomen: soft, non-tender; bowel sounds normal; no masses,  no organomegaly     Assessment:   Pregnancy: G1P0 Patient Active Problem List   Diagnosis Date Noted  . Supervision of low-risk pregnancy 07/10/2018     Plan:    1. Encounter for supervision of low-risk first pregnancy, antepartum - Cytology - PAP( [place]) - US MFM OB COMP + 14 WK; Future - Hemoglobin A1c  Initial labs drawn at previous nurse visit. Pending in EMR due to SMA and CF testing. Other labs printed, reviewed--unremarkable.  Early DM testing indicated. A1c drawn due to patient not wanting to wait for GTT.  Consider starting ASA for risk factor of nulliparity and AA race.  Flu vax declined.  Continue prenatal vitamins. Genetic Screening discussed, Panorama: ordered. Ultrasound discussed; fetal anatomic survey: ordered. Problem list reviewed and updated. [facility] with multiple MDs and other Advanced Practice Providers was explained to patient; also emphasized that residents, students are part of our team. Routine obstetric precautions reviewed. Return in about 4 weeks (around 08/12/2018) for routine PNC.   De HollingsheadCatherine L Bellina Tokarczyk 2:14 PM 07/15/18

## 2018-07-15 NOTE — Patient Instructions (Addendum)
First Trimester of Pregnancy The first trimester of pregnancy is from week 1 until the end of week 13 (months 1 through 3). During this time, your baby will begin to develop inside you. At 6-8 weeks, the eyes and face are formed, and the heartbeat can be seen on ultrasound. At the end of 12 weeks, all the baby's organs are formed. Prenatal care is all the medical care you receive before the birth of your baby. Make sure you get good prenatal care and follow all of your doctor's instructions. Follow these instructions at home: Medicines  Take over-the-counter and prescription medicines only as told by your doctor. Some medicines are safe and some medicines are not safe during pregnancy.  Take a prenatal vitamin that contains at least 600 micrograms (mcg) of folic acid.  If you have trouble pooping (constipation), take medicine that will make your stool soft (stool softener) if your doctor approves. Eating and drinking  Eat regular, healthy meals.  Your doctor will tell you the amount of weight gain that is right for you.  Avoid raw meat and uncooked cheese.  If you feel sick to your stomach (nauseous) or throw up (vomit): ? Eat 4 or 5 small meals a day instead of 3 large meals. ? Try eating a few soda crackers. ? Drink liquids between meals instead of during meals.  To prevent constipation: ? Eat foods that are high in fiber, like fresh fruits and vegetables, whole grains, and beans. ? Drink enough fluids to keep your pee (urine) clear or pale yellow. Activity  Exercise only as told by your doctor. Stop exercising if you have cramps or pain in your lower belly (abdomen) or low back.  Do not exercise if it is too hot, too humid, or if you are in a place of great height (high altitude).  Try to avoid standing for long periods of time. Move your legs often if you must stand in one place for a long time.  Avoid heavy lifting.  Wear low-heeled shoes. Sit and stand up  straight.  You can have sex unless your doctor tells you not to. Relieving pain and discomfort  Wear a good support bra if your breasts are sore.  Take warm water baths (sitz baths) to soothe pain or discomfort caused by hemorrhoids. Use hemorrhoid cream if your doctor says it is okay.  Rest with your legs raised if you have leg cramps or low back pain.  If you have puffy, bulging veins (varicose veins) in your legs: ? Wear support hose or compression stockings as told by your doctor. ? Raise (elevate) your feet for 15 minutes, 3-4 times a day. ? Limit salt in your food. Prenatal care  Schedule your prenatal visits by the twelfth week of pregnancy.  Write down your questions. Take them to your prenatal visits.  Keep all your prenatal visits as told by your doctor. This is important. Safety  Wear your seat belt at all times when driving.  Make a list of emergency phone numbers. The list should include numbers for family, friends, the hospital, and police and fire departments. General instructions  Ask your doctor for a referral to a local prenatal class. Begin classes no later than at the start of month 6 of your pregnancy.  Ask for help if you need counseling or if you need help with nutrition. Your doctor can give you advice or tell you where to go for help.  Do not use hot tubs,  steam rooms, or saunas.  Do not douche or use tampons or scented sanitary pads.  Do not cross your legs for long periods of time.  Avoid all herbs and alcohol. Avoid drugs that are not approved by your doctor.  Do not use any tobacco products, including cigarettes, chewing tobacco, and electronic cigarettes. If you need help quitting, ask your doctor. You may get counseling or other support to help you quit.  Avoid cat litter boxes and soil used by cats. These carry germs that can cause birth defects in the baby and can cause a loss of your baby (miscarriage) or stillbirth.  Visit your dentist.  At home, brush your teeth with a soft toothbrush. Be gentle when you floss. Contact a doctor if:  You are dizzy.  You have mild cramps or pressure in your lower belly.  You have a nagging pain in your belly area.  You continue to feel sick to your stomach, you throw up, or you have watery poop (diarrhea).  You have a bad smelling fluid coming from your vagina.  You have pain when you pee (urinate).  You have increased puffiness (swelling) in your face, hands, legs, or ankles. Get help right away if:  You have a fever.  You are leaking fluid from your vagina.  You have spotting or bleeding from your vagina.  You have very bad belly cramping or pain.  You gain or lose weight rapidly.  You throw up blood. It may look like coffee grounds.  You are around people who have [place] measles, fifth disease, or chickenpox.  You have a very bad headache.  You have shortness of breath.  You have any kind of trauma, such as from a fall or a car accident. Summary  The first trimester of pregnancy is from week 1 until the end of week 13 (months 1 through 3).  To take care of yourself and your unborn baby, you will need to eat healthy meals, take medicines only if your doctor tells you to do so, and do activities that are safe for you and your baby.  Keep all follow-up visits as told by your doctor. This is important as your doctor will have to ensure that your baby is healthy and growing well. This information is not intended to replace advice given to you by your health care provider. Make sure you discuss any questions you have with your health care provider. Document Released: 01/22/2008 Document Revised: 08/13/2016 Document Reviewed: 08/13/2016 Elsevier Interactive Patient Education  2017 [place] [address], [address] [place], [place]  [address] Phone - [phone]   Fax -  [phone]  [facility] [address] [place], [place] [address] Phone - [phone]   Fax - [place] [address] [place], [place]  [address] Phone - [phone]   Fax - [phone]  Victor Burbank. [address], [place] [address] [place], [place]  [address] Phone - [phone]   Fax - [phone]  [place] [address] [place], [place]  [address] Phone - [phone]   Fax - [phone]  [facility] [address] [place], [place]  [address] Phone - [phone]   Fax - [place] [address], [place], [place]  [address] Phone - [phone]   Fax - [phone]  [facility]ILY [facility] AT [facility] [place],  [address] [place], [place]  [address] Phone - [phone]   Fax - [place] [address] [place], [place]  [address] Phone - [phone]   Fax - [phone] [facility] [place] [address] [place], [place]  [address] Phone - [phone]   Fax - [phone]  [place] [address] [place], [place]  [address] Phone - [phone]   Fax - [phone]  [facility] FAMILY MEDICINE AT [place], [place], [place]  [address] Phone - [phone]   Fax - [place] [address], [place], [place]  [address] Phone - [phone]   Fax - [phone]  [facility] [address] [place], [place]  [address] Phone - [place], [place]  [address] Phone - [phone]   Fax - [place] [address] [place], [place]  [address] Phone - [phone]   Fax - [phone]  [place] [address] [place], [place]  [address] Phone -  [phone]   Fax - [place]. [place], [place]  [address] Phone - [phone]   Fax - [place], [place], [place]  [address] Phone - [phone]   Fax - [place] [address], [place], [place]  [address] Phone - [phone]   Fax - [phone]  DAVID RUBIN [address], [place], [place]  [address] Phone - [phone]   Fax - [place] [address] [place], [place]  [address] Phone - [phone]   Fax - [phone]  [place] [address] [place], [place]  [address] Phone - [phone]   Fax - [phone] Arnaldo [place] [address] [place], [place]  [address] Phone - [phone]   Fax - [place] [address] [place], [place]  [address] Phone - [phone]   Fax - [phone]  [place] [address] [place], [place]  [address] Phone - [phone]   Fax - [phone]  [place] [address] [place] [address] Phone [phone]  Fax [phone]  Childbirth Education Options: [facility] Department Classes:  Childbirth education classes can help you get ready for a positive parenting experience. You can also meet other expectant parents and get free stuff for your baby. Each class runs for five weeks on the same night and costs $45 for the mother-to-be and her support person. Medicaid covers the cost if you are eligible. Call [phone] to register. [facility] Childbirth Education:  [phone] or [phone] or [email or web address]  Baby & Me Class: Discuss newborn & infant parenting and family adjustment issues with other new mothers in a relaxed environment. Each  week brings a new speaker or baby-centered activity. We encourage new mothers to join [place] every Thursday at 11:00am. Babies birth until crawling. No registration or fee. Daddy [employer]: This course offers Dads-to-be the tools and knowledge needed to feel confident on their journey to becoming new fathers. Experienced dads, who have been trained as coaches, teach dads-to-be how to hold, comfort, diaper, swaddle and play with their infant while being able to support the new mom  as well. A class for men taught by men. $25/dad Big Brother/Big Sister: Let your children share in the joy of a new brother or sister in this special class designed just for them. Class includes discussion about how families care for babies: swaddling, holding, diapering, safety as well as how they can be helpful in their new role. This class is designed for children ages 52 to 69, but any age is welcome. Please register each child individually. $5/child  Mom Talk: This mom-led group offers support and connection to mothers as they journey through the adjustments and struggles of that sometimes overwhelming first year after the birth of a child. Tuesdays at 10:00am and Thursdays at 6:00pm. Babies welcome. No registration or fee. Breastfeeding Support Group: This group is a mother-to-mother support circle where moms have the opportunity to share their breastfeeding experiences. A Lactation Consultant is present for questions and concerns. Meets each Tuesday at 11:00am. No fee or registration. Breastfeeding Your Baby: Learn what to expect in the first days of breastfeeding your newborn.  This class will help you feel more confident with the skills needed to begin your breastfeeding experience. Many new mothers are concerned about breastfeeding after leaving the hospital. This class will also address the most common fears and challenges about breastfeeding during the first few weeks, months and beyond. (call for fee) Comfort Techniques and  Tour: This 2 hour interactive class will provide you the opportunity to learn & practice hands-on techniques that can help relieve some of the discomfort of labor and encourage your baby to rotate toward the best position for birth. You and your partner will be able to try a variety of labor positions with birth balls and rebozos as well as practice breathing, relaxation, and visualization techniques. A tour of the [facility] is included with this class. $20 per registrant and support person Childbirth Class- Weekend Option: This class is a Weekend version of our Birth & Baby series. It is designed for parents who have a difficult time fitting several weeks of classes into their schedule. It covers the care of your newborn and the basics of labor and childbirth. It also includes a [facility] and lunch. The class is held two consecutive days: beginning on Friday evening from 6:30 - 8:30 p.m. and the next day, Saturday from 9 a.m. - 4 p.m. (call for fee) Doren Custard Class: Interested in a waterbirth?  This informational class will help you discover whether waterbirth is the right fit for you. Education about waterbirth itself, supplies you would need and how to assemble your support team is what you can expect from this class. Some obstetrical practices require this class in order to pursue a waterbirth. (Not all obstetrical practices offer waterbirth-check with your healthcare provider.) Register only the expectant mom, but you are encouraged to bring your partner to class! Required if planning waterbirth, no fee. Infant/Child CPR: Parents, grandparents, babysitters, and friends learn Cardio-Pulmonary Resuscitation skills for infants and children. You will also learn how to treat both conscious and unconscious choking in infants and children. This Family & Friends program does not offer certification. Register each participant individually to ensure  that enough mannequins are available. (Call for fee) Grandparent Love: Expecting a grandbaby? This class is for you! Learn about the latest infant care and safety recommendations and ways to support your own child as he or she transitions into the parenting role. Taught by Registered Nurses who are childbirth instructors, but  most importantly...they are grandmothers too! $10/person. Childbirth Class- Natural Childbirth: This series of 5 weekly classes is for expectant parents who want to learn and practice natural methods of coping with the process of labor and childbirth. Relaxation, breathing, massage, visualization, role of the partner, and helpful positioning are highlighted. Participants learn how to be confident in their body's ability to give birth. This class will empower and help parents make informed decisions about their own care. Includes discussion that will help new parents transition into the immediate postpartum period. [facility] is included. We suggest taking this class between 25-32 weeks, but it's only a recommendation. $75 per registrant and one support person or $30 Medicaid. Childbirth Class- 3 week Series: This option of 3 weekly classes helps you and your labor partner prepare for childbirth. Newborn care, labor & birth, cesarean birth, pain management, and comfort techniques are discussed and a [place] [facility] is included. The class meets at the same time, on the same day of the week for 3 consecutive weeks beginning with the starting date you choose. $60 for registrant and one support person.  Marvelous Multiples: Expecting twins, triplets, or more? This class covers the differences in labor, birth, parenting, and breastfeeding issues that face multiples' parents. NICU tour is included. Led by a Certified Childbirth Educator who is the mother of twins. No fee. Caring for Baby: This class is for expectant and adoptive  parents who want to learn and practice the most up-to-date newborn care for their babies. Focus is on birth through the first six weeks of life. Topics include feeding, bathing, diapering, crying, umbilical cord care, circumcision care and safe sleep. Parents learn to recognize symptoms of illness and when to call the pediatrician. Register only the mom-to-be and your partner or support person can plan to come with you! $10 per registrant and support person Childbirth Class- online option: This online class offers you the freedom to complete a Birth and Baby series in the comfort of your own home. The flexibility of this option allows you to review sections at your own pace, at times convenient to you and your support people. It includes additional video information, animations, quizzes, and extended activities. Get organized with helpful eClass tools, checklists, and trackers. Once you register online for the class, you will receive an email within a few days to accept the invitation and begin the class when the time is right for you. The content will be available to you for 60 days. $60 for 60 days of online access for you and your support people.  Local Doulas: Natural Baby Doulas [email or web address] Tel: [phone] [email or web address] [email or web address] [phone] [email or web address] The Labor Hassell Halim  (also do waterbirth tub rental) [phone] [email or web address] Birth [email or web address] [phone] [email or web address] [place] [email or web address] [employer] (PADA) [email or web address] [place] Birth and Baby  [email or web address]/ Considering Waterbirth? Guide for patients at [facility] [employer]  Why consider waterbirth?  . Gentle birth for  babies . Less pain medicine used in labor . May allow for passive descent/less pushing . May reduce perineal tears  . More mobility and instinctive maternal position changes . Increased maternal relaxation . Reduced blood pressure in labor  Is waterbirth safe? What are the risks of infection, drowning or other complications?  . Infection: o Very low risk (3.7 % for tub vs 4.8% for bed) o 7 in 8000 waterbirths with documented infection o Poorly cleaned equipment  most common cause o Slightly lower group B strep transmission rate  . Drowning o Maternal:  - Very low risk   - Related to seizures or fainting o Newborn:  - Very low risk. No evidence of increased risk of respiratory problems in multiple large studies - Physiological protection from breathing under water - Avoid underwater birth if there are any fetal complications - Once baby's head is out of the water, keep it out.  . Birth complication o Some reports of cord trauma, but risk decreased by bringing baby to surface gradually o No evidence of increased risk of shoulder dystocia. Mothers can usually change positions faster in water than in a bed, possibly aiding the maneuvers to free the shoulder.   You must attend a Doren Custard class at [facility]  3rd Wednesday of every month from 7-9pm  [employer] by calling [phone] or online at [email or web address]  Bring [place] the certificate from the class to your prenatal appointment  Meet with a midwife at 36 weeks to see if you can still plan a waterbirth and to sign the consent.   Purchase or rent the following supplies:   Water Birth Pool (Birth Pool in a Box or [place] for instance)  (Tubs start ~$125)  Single-use disposable tub liner designed for your brand of tub  New garden hose labeled "lead-free", "suitable for drinking water",  Electric drain pump to remove water (We recommend 792 gallon per hour or greater pump.)   Separate garden hose to  remove the dirty water  Fish net  Bathing suit top (optional)  Long-handled mirror (optional)  Places to purchase or rent supplies  GotWebTools.is for tub purchases and supplies  Waterbirthsolutions.com for tub purchases and supplies  The Labor Ladies (www.[email or web address]) $275 for tub rental/set-up & take down/kit   [employer] (http://www.fleming.com/.htm) Information regarding doulas (labor support) who provide pool rentals  Our practice has a Birth Pool in a Box tub at the hospital that you may borrow on a first-come-first-served basis. It is your responsibility to to set up, clean and break down the tub. We cannot guarantee the availability of this tub in advance. You are responsible for bringing all accessories listed above. If you do not have all necessary supplies you cannot have a waterbirth.    Things that would prevent you from having a waterbirth:  Premature, <37wks  Previous cesarean birth  Presence of thick meconium-stained fluid  Multiple gestation (Twins, triplets, etc.)  Uncontrolled diabetes or gestational diabetes requiring medication  Hypertension requiring medication or diagnosis of pre-eclampsia  Heavy vaginal bleeding  Non-reassuring fetal heart rate  Active infection (MRSA, etc.). Group B Strep is NOT a contraindication for  waterbirth.  If your labor has to be induced and induction method requires continuous  monitoring of the baby's heart rate  Other risks/issues identified by your obstetrical provider  Please remember that birth is unpredictable. Under certain unforeseeable circumstances your provider may advise against giving birth in the tub. These decisions will be made on a case-by-case basis and with the safety of you and your baby as our highest priority.

## 2018-07-16 LAB — HEMOGLOBIN A1C
ESTIMATED AVERAGE GLUCOSE: 97 mg/dL
HEMOGLOBIN A1C: 5 % (ref 4.8–5.6)

## 2018-07-20 LAB — OBSTETRIC PANEL, INCLUDING HIV
ANTIBODY SCREEN: NEGATIVE
BASOS: 1 %
Basophils Absolute: 0 10*3/uL (ref 0.0–0.2)
EOS (ABSOLUTE): 0.1 10*3/uL (ref 0.0–0.4)
EOS: 1 %
HEMOGLOBIN: 12.6 g/dL (ref 11.1–15.9)
HEP B S AG: NEGATIVE
HIV Screen 4th Generation wRfx: NONREACTIVE
Hematocrit: 35.2 % (ref 34.0–46.6)
IMMATURE GRANS (ABS): 0 10*3/uL (ref 0.0–0.1)
IMMATURE GRANULOCYTES: 1 %
Lymphocytes Absolute: 1.4 10*3/uL (ref 0.7–3.1)
Lymphs: 24 %
MCH: 32.3 pg (ref 26.6–33.0)
MCHC: 35.8 g/dL — ABNORMAL HIGH (ref 31.5–35.7)
MCV: 90 fL (ref 79–97)
MONOCYTES: 6 %
MONOS ABS: 0.3 10*3/uL (ref 0.1–0.9)
NEUTROS PCT: 67 %
Neutrophils Absolute: 4 10*3/uL (ref 1.4–7.0)
Platelets: 320 10*3/uL (ref 150–450)
RBC: 3.9 x10E6/uL (ref 3.77–5.28)
RDW: 12.2 % — ABNORMAL LOW (ref 12.3–15.4)
RH TYPE: POSITIVE
RPR Ser Ql: NONREACTIVE
RUBELLA: 2.07 {index} (ref >0.99)
WBC: 5.9 10*3/uL (ref 3.4–10.8)

## 2018-07-20 LAB — CYSTIC FIBROSIS GENE TEST

## 2018-07-20 LAB — SMN1 COPY NUMBER ANALYSIS (SMA CARRIER SCREENING)

## 2018-07-20 LAB — HEMOGLOBINOPATHY EVALUATION
FERRITIN: 92 ng/mL (ref 15–150)
HGB A2 QUANT: 2.1 % (ref 1.8–3.2)
HGB A: 97.9 % (ref 96.4–98.8)
HGB SOLUBILITY: NEGATIVE
HGB VARIANT: 0 %
Hgb C: 0 %
Hgb F Quant: 0 % (ref 0.0–2.0)
Hgb S: 0 %

## 2018-07-20 LAB — CYTOLOGY - PAP
Chlamydia: NEGATIVE
Diagnosis: NEGATIVE
Neisseria Gonorrhea: NEGATIVE

## 2018-07-27 ENCOUNTER — Telehealth: Payer: Self-pay | Admitting: *Deleted

## 2018-07-27 ENCOUNTER — Encounter: Payer: Self-pay | Admitting: General Practice

## 2018-07-27 NOTE — Telephone Encounter (Signed)
Received a voicemail from [place] calling re: missing information. I called and provided missing information ( ordering prescriber).

## 2018-07-27 NOTE — Telephone Encounter (Signed)
Jennifer Sanchez called and left a message today she has questions about her MyChart results.

## 2018-07-27 NOTE — Telephone Encounter (Signed)
I called Sya back and left a message I am returning her call and sorry that I missed her; she may call us back or send MyChart message.

## 2018-07-28 ENCOUNTER — Encounter: Payer: Self-pay | Admitting: *Deleted

## 2018-07-28 ENCOUNTER — Telehealth: Payer: Self-pay | Admitting: *Deleted

## 2018-07-28 NOTE — Telephone Encounter (Signed)
Jennifer Sanchez called back and left message she is calling back because she missed our call back yesterday- still wanting to know if gender results are back.  I called her and notified her we do not have the panorama results yet; but should soon. I advised her to call back and when we have them she can come by the office to get a copy; we cannot give over the phone. She voices understanding.

## 2018-07-30 ENCOUNTER — Telehealth: Payer: Self-pay

## 2018-07-30 NOTE — Telephone Encounter (Signed)
Pt called requesting test results.  LM that we are returning her call and we will send a MyChart message.

## 2018-08-17 ENCOUNTER — Encounter: Payer: Medicaid Other | Admitting: Internal Medicine

## 2018-08-18 ENCOUNTER — Encounter: Payer: Self-pay | Admitting: General Practice

## 2018-08-19 NOTE — L&D Delivery Note (Signed)
Delivery Note    After AROM, pt progressed steadily through labor.  When she was noted to be C/C/+2, she decided to push. Her left labia majora noted to be edematous, co-exam w/Dr. [place] Scrape an abscess, seems to be soft tissue swelling.  After a 10 minute 2nd stage, at 5:24 PM a viable female was delivered via  (Presentation: ROA, loose nuchal cord, reduced ).  APGAR:9/9 ; weight pending.  After 1 minute, the cord was clamped and cut. 40 units of pitocin diluted in 1000cc LR was infused rapidly IV.  The placenta separated spontaneously and delivered via CCT and maternal pushing effort.  It was inspected and appears to be intact with a 3 VC.   Anesthesia:  epidural Episiotomy:  none Lacerations:  none Suture Repair:  Est. Blood Loss (mL):  119  Mom to postpartum.  Baby to Couplet care / Skin to Skin.  Christin Fudge 01/28/2019, 5:38 PM

## 2018-08-20 ENCOUNTER — Telehealth: Payer: Self-pay | Admitting: Family Medicine

## 2018-08-20 NOTE — Telephone Encounter (Signed)
Left the patient a message to call our clinic. Scheduled the appointment and sent the patient a reminder letter.

## 2018-08-25 ENCOUNTER — Inpatient Hospital Stay (HOSPITAL_COMMUNITY)
Admission: AD | Admit: 2018-08-25 | Discharge: 2018-08-25 | Discharge status: Against Medical Advice | Payer: Medicaid Other | Attending: Obstetrics & Gynecology | Admitting: Obstetrics & Gynecology

## 2018-08-25 ENCOUNTER — Other Ambulatory Visit: Payer: Self-pay

## 2018-08-25 ENCOUNTER — Encounter (HOSPITAL_COMMUNITY): Payer: Self-pay

## 2018-08-25 DIAGNOSIS — R0602 Shortness of breath: Secondary | ICD-10-CM | POA: Diagnosis present

## 2018-08-25 DIAGNOSIS — Z3A16 16 weeks gestation of pregnancy: Secondary | ICD-10-CM | POA: Diagnosis not present

## 2018-08-25 DIAGNOSIS — R1012 Left upper quadrant pain: Secondary | ICD-10-CM | POA: Diagnosis not present

## 2018-08-25 DIAGNOSIS — O26892 Other specified pregnancy related conditions, second trimester: Secondary | ICD-10-CM

## 2018-08-25 DIAGNOSIS — Z880 Allergy status to penicillin: Secondary | ICD-10-CM | POA: Insufficient documentation

## 2018-08-25 LAB — URINALYSIS, ROUTINE W REFLEX MICROSCOPIC
BILIRUBIN URINE: NEGATIVE
GLUCOSE, UA: NEGATIVE mg/dL
Hgb urine dipstick: NEGATIVE
Ketones, ur: NEGATIVE mg/dL
NITRITE: NEGATIVE
Protein, ur: NEGATIVE mg/dL
SPECIFIC GRAVITY, URINE: 1.016 (ref 1.005–1.030)
pH: 7 (ref 5.0–8.0)

## 2018-08-25 NOTE — MAU Provider Note (Signed)
History     CSN: [id number]  Arrival date and time: 08/25/18 2130   First Provider Initiated Contact with Patient 08/25/18 2214      Chief Complaint  Patient presents with  . Shortness of Breath  . Abdominal Pain   Jennifer Sanchez is a 23 y.o. G1P0 at 8441w3d who presents today with shortness of breath and abdominal pain.   Shortness of Breath  This is a new problem. The current episode started in the past 7 days (Started 1/5 ). The problem occurs constantly. The problem has been unchanged. Associated symptoms include abdominal pain. Pertinent negatives include no fever, rhinorrhea or vomiting. Nothing aggravates the symptoms. Risk factors: pregnancy  She has tried nothing for the symptoms. Her past medical history is significant for aspirin allergies. There is no history of allergies or asthma.  Abdominal Pain  This is a new problem. The current episode started today. The onset quality is gradual. The problem occurs constantly. The problem has been unchanged. The pain is located in the LUQ. The pain is at a severity of 8/10. The quality of the pain is cramping and a sensation of fullness. The abdominal pain does not radiate. Pertinent negatives include no constipation, dysuria, fever, nausea or vomiting. Nothing aggravates the pain. She has tried nothing for the symptoms.    OB History    Gravida  1   Para      Term      Preterm      AB      Living        SAB      TAB      Ectopic      Multiple      Live Births              Past Medical History:  Diagnosis Date  . Medical history non-contributory     Past Surgical History:  Procedure Laterality Date  . WISDOM TOOTH EXTRACTION  2015    Family History  Problem Relation Age of Onset  . Lupus Mother   . Atrial fibrillation Mother   . Endometriosis Sister   . Hypertension Sister     Social History   Tobacco Use  . Smoking status: Never Smoker  . Smokeless tobacco: Never Used  Substance Use  Topics  . Alcohol use: No  . Drug use: No    Allergies:  Allergies  Allergen Reactions  . Aspirin Shortness Of Breath  . Penicillins Anaphylaxis  . Betadine [Povidone Iodine] Hives  . Iodine Hives  . Shellfish Allergy     hives    Medications Prior to Admission  Medication Sig Dispense Refill Last Dose  . Prenatal Vit-Fe Fumarate-FA (MULTIVITAMIN-PRENATAL) 27-0.8 MG TABS tablet Take 1 tablet by mouth daily at 12 noon.   Taking    Review of Systems  Constitutional: Negative for chills and fever.  HENT: Negative for congestion and rhinorrhea.   Respiratory: Positive for shortness of breath.   Gastrointestinal: Positive for abdominal pain. Negative for constipation, nausea and vomiting.  Genitourinary: Negative for dysuria, pelvic pain, vaginal bleeding and vaginal discharge.   Physical Exam   Blood pressure 122/70, pulse (!) 105, temperature 98.1 F (36.7 C), temperature source Oral, resp. rate 18, last menstrual period 05/02/2018, SpO2 100 %.  Physical Exam  Nursing note and vitals reviewed. Constitutional: She is oriented to person, place, and time. She appears well-developed and well-nourished. No distress.  HENT:  Head: Normocephalic.  Cardiovascular: Normal rate.  Respiratory: Breath sounds  normal.  Using accessory muscles to breathe.   GI: There is no abdominal tenderness. There is no rebound.  Neurological: She is alert and oriented to person, place, and time.  Skin: Skin is warm and dry.  Psychiatric: She has a normal mood and affect.    +FHT 160 with doppler   MAU Course  Procedures  MDM DW patient that CT scan is recommended to evaluate for PE. Pregnancy increases risk for PE, and patient is here with sudden onset and worsening shortness of breath. She is visibly using accessory muscles while talking to me. O2 sat is normal and remains normal during ambulation. Patient states that she is not comfortable with CT scan or with waiting for this to be done as  she needs to be somewhere at 2330. Patient will sign out AMA. PE precautions discussed with patient and she may return here with any worsening S/S.   Assessment and Plan   1. Shortness of breath due to pregnancy in second trimester   2. [redacted] weeks gestation of pregnancy    Patient left AMA  Thressa Sheller DNP, CNM  08/25/18  10:20 PM

## 2018-08-25 NOTE — MAU Note (Addendum)
Pt presents to MAU c/o SOB and left sided pain. Pt states she sits down at work all day but today she was so SOB just sitting it made it hard to talk on the phone. Pt states she has a pressure on her left mid side that has been ongoing through the day pt thought it was maybe bowel but there was no relief after her BM. No bleeding or LOF. Pt denies any chest pain.  Pt ambulated with pulse O2 and was saturating at 99-100 and had a HR of 100-113.

## 2018-08-25 NOTE — MAU Note (Signed)
Pt states that she needs to leave to pick up a friend. Pt understands that she is leaving against medical advise. AMA form signed by patient. Pt ambulated off unit.

## 2018-09-01 ENCOUNTER — Encounter: Payer: Medicaid Other | Admitting: Advanced Practice Midwife

## 2018-09-04 ENCOUNTER — Encounter (HOSPITAL_COMMUNITY): Payer: Self-pay

## 2018-09-10 ENCOUNTER — Ambulatory Visit (INDEPENDENT_AMBULATORY_CARE_PROVIDER_SITE_OTHER): Payer: Medicaid Other | Admitting: Obstetrics and Gynecology

## 2018-09-10 DIAGNOSIS — Z3A18 18 weeks gestation of pregnancy: Secondary | ICD-10-CM

## 2018-09-10 DIAGNOSIS — Z3491 Encounter for supervision of normal pregnancy, unspecified, first trimester: Secondary | ICD-10-CM

## 2018-09-10 DIAGNOSIS — Z3492 Encounter for supervision of normal pregnancy, unspecified, second trimester: Secondary | ICD-10-CM

## 2018-09-10 NOTE — Patient Instructions (Signed)

## 2018-09-10 NOTE — Progress Notes (Signed)
   PRENATAL VISIT NOTE  Subjective:  Jennifer Sanchez is a 23 y.o. G1P0 at [redacted]w[redacted]d being seen today for ongoing prenatal care.  She is currently monitored for the following issues for this low-risk pregnancy and has Supervision of low-risk pregnancy on their problem list.  Patient reports no complaints.  Contractions: Not present. Vag. Bleeding: None.  Movement: Absent. Denies leaking of fluid.   The following portions of the patient's history were reviewed and updated as appropriate: allergies, current medications, past family history, past medical history, past social history, past surgical history and problem list. Problem list updated.  Objective:   Vitals:   09/10/18 0836  BP: 115/71  Pulse: 92  Weight: 176 lb (79.8 kg)    Fetal Status: Fetal Heart Rate (bpm): 154   Movement: Absent     General:  Alert, oriented and cooperative. Patient is in no acute distress.  Skin: Skin is warm and dry. No rash noted.   Cardiovascular: Normal heart rate noted  Respiratory: Normal respiratory effort, no problems with respiration noted  Abdomen: Soft, gravid, appropriate for gestational age.  Pain/Pressure: Present     Pelvic: Cervical exam deferred        Extremities: Normal range of motion.  Edema: None  Mental Status: Normal mood and affect. Normal behavior. Normal judgment and thought content.   Assessment and Plan:  Pregnancy: G1P0 at [redacted]w[redacted]d  1. Encounter for supervision of low-risk pregnancy in first trimester  - AFP, Serum, Open Spina Bifida - Doing well. Reviewed previous labs.   There are no diagnoses linked to this encounter. Preterm labor symptoms and general obstetric precautions including but not limited to vaginal bleeding, contractions, leaking of fluid and fetal movement were reviewed in detail with the patient. Please refer to After Visit Summary for other counseling recommendations.  Return in about 4 weeks (around 10/08/2018).  Future Appointments  Date Time  Provider Department Center  09/11/2018 10:45 AM [facility] [place] 2 [facility]    Venia Carbon, NP

## 2018-09-11 ENCOUNTER — Other Ambulatory Visit (HOSPITAL_COMMUNITY): Payer: Medicaid Other

## 2018-09-12 LAB — AFP, SERUM, OPEN SPINA BIFIDA
AFP MoM: 0.8
AFP VALUE AFPOSL: 34.5 ng/mL
GEST. AGE ON COLLECTION DATE: 18 wk
MATERNAL AGE AT EDD: 21.7 a
OSBR Risk 1 IN: 10000
Test Results:: NEGATIVE
WEIGHT: 176 [lb_av]

## 2018-09-15 ENCOUNTER — Other Ambulatory Visit (HOSPITAL_COMMUNITY): Payer: Self-pay | Admitting: *Deleted

## 2018-09-15 ENCOUNTER — Ambulatory Visit (HOSPITAL_COMMUNITY)
Admission: RE | Admit: 2018-09-15 | Discharge: 2018-09-15 | Disposition: A | Discharge status: Home / Self Care | Payer: Medicaid Other | Source: Ambulatory Visit | Attending: Internal Medicine | Admitting: Internal Medicine

## 2018-09-15 ENCOUNTER — Other Ambulatory Visit: Payer: Self-pay | Admitting: Internal Medicine

## 2018-09-15 DIAGNOSIS — Z3A19 19 weeks gestation of pregnancy: Secondary | ICD-10-CM | POA: Diagnosis not present

## 2018-09-15 DIAGNOSIS — Z363 Encounter for antenatal screening for malformations: Secondary | ICD-10-CM | POA: Diagnosis not present

## 2018-09-15 DIAGNOSIS — O359XX0 Maternal care for (suspected) fetal abnormality and damage, unspecified, not applicable or unspecified: Secondary | ICD-10-CM

## 2018-09-15 DIAGNOSIS — Z3400 Encounter for supervision of normal first pregnancy, unspecified trimester: Secondary | ICD-10-CM | POA: Diagnosis present

## 2018-09-15 DIAGNOSIS — O358XX0 Maternal care for other (suspected) fetal abnormality and damage, not applicable or unspecified: Secondary | ICD-10-CM

## 2018-09-15 DIAGNOSIS — O35EXX0 Maternal care for other (suspected) fetal abnormality and damage, fetal genitourinary anomalies, not applicable or unspecified: Secondary | ICD-10-CM

## 2018-10-08 ENCOUNTER — Encounter: Payer: Medicaid Other | Admitting: Obstetrics and Gynecology

## 2018-10-08 ENCOUNTER — Other Ambulatory Visit (HOSPITAL_COMMUNITY): Payer: Self-pay | Admitting: *Deleted

## 2018-10-13 ENCOUNTER — Other Ambulatory Visit (HOSPITAL_COMMUNITY): Payer: Self-pay | Admitting: *Deleted

## 2018-10-13 ENCOUNTER — Encounter (HOSPITAL_COMMUNITY): Payer: Self-pay

## 2018-10-13 ENCOUNTER — Ambulatory Visit (HOSPITAL_COMMUNITY)
Admission: RE | Admit: 2018-10-13 | Discharge: 2018-10-13 | Disposition: A | Discharge status: Home / Self Care | Payer: Medicaid Other | Source: Ambulatory Visit | Attending: Family Medicine | Admitting: Family Medicine

## 2018-10-13 ENCOUNTER — Other Ambulatory Visit (HOSPITAL_COMMUNITY): Payer: Medicaid Other

## 2018-10-13 DIAGNOSIS — O35EXX0 Maternal care for other (suspected) fetal abnormality and damage, fetal genitourinary anomalies, not applicable or unspecified: Secondary | ICD-10-CM

## 2018-10-13 DIAGNOSIS — Z3A23 23 weeks gestation of pregnancy: Secondary | ICD-10-CM | POA: Diagnosis not present

## 2018-10-13 DIAGNOSIS — O358XX0 Maternal care for other (suspected) fetal abnormality and damage, not applicable or unspecified: Secondary | ICD-10-CM | POA: Diagnosis not present

## 2018-10-13 DIAGNOSIS — O359XX0 Maternal care for (suspected) fetal abnormality and damage, unspecified, not applicable or unspecified: Secondary | ICD-10-CM

## 2018-10-13 DIAGNOSIS — Z362 Encounter for other antenatal screening follow-up: Secondary | ICD-10-CM

## 2018-10-13 NOTE — Patient Instructions (Signed)
j

## 2018-10-14 ENCOUNTER — Encounter: Payer: Medicaid Other | Admitting: Medical

## 2018-10-15 ENCOUNTER — Encounter: Payer: Medicaid Other | Admitting: Obstetrics and Gynecology

## 2018-10-15 ENCOUNTER — Telehealth: Payer: Self-pay | Admitting: Obstetrics and Gynecology

## 2018-10-15 ENCOUNTER — Encounter: Payer: Self-pay | Admitting: Obstetrics and Gynecology

## 2018-10-15 NOTE — Telephone Encounter (Signed)
Called the patient to inform of missed appointment. The first number a caller picked up and stated we have the wrong number. The contact number on file there was no option to leave a message. Sending a missed appointment letter.

## 2018-10-15 NOTE — Telephone Encounter (Signed)
Called patient about her missed appointment, and needed to get her rescheduled. Was not able to reach her.

## 2018-10-22 ENCOUNTER — Ambulatory Visit (INDEPENDENT_AMBULATORY_CARE_PROVIDER_SITE_OTHER): Payer: Medicaid Other | Admitting: Obstetrics and Gynecology

## 2018-10-22 DIAGNOSIS — Z3491 Encounter for supervision of normal pregnancy, unspecified, first trimester: Secondary | ICD-10-CM

## 2018-10-22 DIAGNOSIS — Z3492 Encounter for supervision of normal pregnancy, unspecified, second trimester: Secondary | ICD-10-CM

## 2018-10-22 DIAGNOSIS — Z3A24 24 weeks gestation of pregnancy: Secondary | ICD-10-CM

## 2018-10-22 NOTE — Progress Notes (Signed)
   PRENATAL VISIT NOTE  Subjective:  Jennifer Sanchez is a 23 y.o. G1P0 at [redacted]w[redacted]d being seen today for ongoing prenatal care.  She is currently monitored for the following issues for this low-risk pregnancy and has Supervision of low-risk pregnancy on their problem list.  Patient reports no complaints.  Contractions: Not present. Vag. Bleeding: None.  Movement: Present. Denies leaking of fluid.   The following portions of the patient's history were reviewed and updated as appropriate: allergies, current medications, past family history, past medical history, past social history, past surgical history and problem list. Problem list updated.  Objective:   Vitals:   10/22/18 1133  BP: 121/74  Pulse: 87  Weight: 184 lb (83.5 kg)    Fetal Status: Fetal Heart Rate (bpm): 153 Fundal Height: 27 cm Movement: Present     General:  Alert, oriented and cooperative. Patient is in no acute distress.  Skin: Skin is warm and dry. No rash noted.   Cardiovascular: Normal heart rate noted  Respiratory: Normal respiratory effort, no problems with respiration noted  Abdomen: Soft, gravid, appropriate for gestational age.  Pain/Pressure: Present     Pelvic: Cervical exam deferred        Extremities: Normal range of motion.  Edema: None  Mental Status: Normal mood and affect. Normal behavior. Normal judgment and thought content.   Assessment and Plan:  Pregnancy: G1P0 at [redacted]w[redacted]d  1. Encounter for supervision of low-risk pregnancy in first trimester  -Doing well, no complaints -Will get tdap  at next visit.    Preterm labor symptoms and general obstetric precautions including but not limited to vaginal bleeding, contractions, leaking of fluid and fetal movement were reviewed in detail with the patient. Please refer to After Visit Summary for other counseling recommendations.  Return in about 4 weeks (around 11/19/2018) for for 2 hour glucose testing .  Future Appointments  Date Time Provider  Department Center  11/19/2018  8:15 AM Daelan Gatt, Harolyn Rutherford, NP [facility]  11/19/2018  8:50 AM [facility]  12/07/2018 10:45 AM [facility] [place] 2 [facility]    Venia Carbon, NP

## 2018-10-22 NOTE — Patient Instructions (Signed)
Tdap Vaccine (Tetanus, Diphtheria and Pertussis): What You Need to Know  1. Why get vaccinated?  Tetanus, diphtheria and pertussis are very serious diseases. Tdap vaccine can protect us from these diseases. And, Tdap vaccine given to pregnant women can protect newborn babies against pertussis..  TETANUS (Lockjaw) is rare in the [place] today. It causes painful muscle tightening and stiffness, usually all over the body.  · It can lead to tightening of muscles in the head and neck so you can't open your mouth, swallow, or sometimes even breathe. Tetanus kills about 1 out of 10 people who are infected even after receiving the best medical care.  DIPHTHERIA is also rare in the [place] today. It can cause a thick coating to form in the back of the throat.  · It can lead to breathing problems, heart failure, paralysis, and death.  PERTUSSIS (Whooping Cough) causes severe coughing spells, which can cause difficulty breathing, vomiting and disturbed sleep.  · It can also lead to weight loss, incontinence, and rib fractures. Up to 2 in 100 adolescents and 5 in 100 adults with pertussis are hospitalized or have complications, which could include pneumonia or death.  These diseases are caused by bacteria. Diphtheria and pertussis are spread from person to person through secretions from coughing or sneezing. Tetanus enters the body through cuts, scratches, or wounds.  Before vaccines, as many as 200,000 cases of diphtheria, 200,000 cases of pertussis, and hundreds of cases of tetanus, were reported in the [place] each year. Since vaccination began, reports of cases for tetanus and diphtheria have dropped by about 99% and for pertussis by about 80%.  2. Tdap vaccine  Tdap vaccine can protect adolescents and adults from tetanus, diphtheria, and pertussis. One dose of Tdap is routinely given at age 11 or 12. People who did not get Tdap at that age should get it as soon as possible.  Tdap is especially important  for healthcare professionals and anyone having close contact with a baby younger than 12 months.  Pregnant women should get a dose of Tdap during every pregnancy, to protect the newborn from pertussis. Infants are most at risk for severe, life-threatening complications from pertussis.  Another vaccine, called Td, protects against tetanus and diphtheria, but not pertussis. A Td booster should be given every 10 years. Tdap may be given as one of these boosters if you have never gotten Tdap before. Tdap may also be given after a severe cut or burn to prevent tetanus infection.  Your doctor or the person giving you the vaccine can give you more information.  Tdap may safely be given at the same time as other vaccines.  3. Some people should not get this vaccine  · A person who has ever had a life-threatening allergic reaction after a previous dose of any diphtheria, tetanus or pertussis containing vaccine, OR has a severe allergy to any part of this vaccine, should not get Tdap vaccine. Tell the person giving the vaccine about any severe allergies.  · Anyone who had coma or long repeated seizures within 7 days after a childhood dose of DTP or DTaP, or a previous dose of Tdap, should not get Tdap, unless a cause other than the vaccine was found. They can still get Td.  · Talk to your doctor if you:  ? have seizures or another nervous system problem,  ? had severe pain or swelling after any vaccine containing diphtheria, tetanus or pertussis,  ? ever had a condition   called Guillain-Barré Syndrome (GBS),  ? aren't feeling well on the day the shot is scheduled.  4. Risks  With any medicine, including vaccines, there is a chance of side effects. These are usually mild and go away on their own. Serious reactions are also possible but are rare.  Most people who get Tdap vaccine do not have any problems with it.  Mild problems following Tdap  (Did not interfere with activities)  · Pain where the shot was given (about 3 in 4  adolescents or 2 in 3 adults)  · Redness or swelling where the shot was given (about 1 person in 5)  · Mild fever of at least 100.4°F (up to about 1 in 25 adolescents or 1 in 100 adults)  · Headache (about 3 or 4 people in 10)  · Tiredness (about 1 person in 3 or 4)  · Nausea, vomiting, diarrhea, stomach ache (up to 1 in 4 adolescents or 1 in 10 adults)  · Chills, sore joints (about 1 person in 10)  · Body aches (about 1 person in 3 or 4)  · Rash, swollen glands (uncommon)  Moderate problems following Tdap  (Interfered with activities, but did not require medical attention)  · Pain where the shot was given (up to 1 in 5 or 6)  · Redness or swelling where the shot was given (up to about 1 in 16 adolescents or 1 in 12 adults)  · Fever over 102°F (about 1 in 100 adolescents or 1 in 250 adults)  · Headache (about 1 in 7 adolescents or 1 in 10 adults)  · Nausea, vomiting, diarrhea, stomach ache (up to 1 or 3 people in 100)  · Swelling of the entire arm where the shot was given (up to about 1 in 500).  Severe problems following Tdap  (Unable to perform usual activities; required medical attention)  · Swelling, severe pain, bleeding and redness in the arm where the shot was given (rare).  Problems that could happen after any vaccine:  · People sometimes faint after a medical procedure, including vaccination. Sitting or lying down for about 15 minutes can help prevent fainting, and injuries caused by a fall. Tell your doctor if you feel dizzy, or have vision changes or ringing in the ears.  · Some people get severe pain in the shoulder and have difficulty moving the arm where a shot was given. This happens very rarely.  · Any medication can cause a severe allergic reaction. Such reactions from a vaccine are very rare, estimated at fewer than 1 in a million doses, and would happen within a few minutes to a few hours after the vaccination.  As with any medicine, there is a very remote chance of a vaccine causing a serious  injury or death.  The safety of vaccines is always being monitored. For more information, visit: www.cdc.gov/vaccinesafety/  5. What if there is a serious problem?  What should I look for?  · Look for anything that concerns you, such as signs of a severe allergic reaction, very high fever, or unusual behavior.  Signs of a severe allergic reaction can include hives, swelling of the face and throat, difficulty breathing, a fast heartbeat, dizziness, and weakness. These would usually start a few minutes to a few hours after the vaccination.  What should I do?  · If you think it is a severe allergic reaction or other emergency that can't wait, call 9-1-1 or get the person to the nearest hospital. Otherwise,   call your doctor.  · Afterward, the reaction should be reported to the Vaccine Adverse Event Reporting System (VAERS). Your doctor might file this report, or you can do it yourself through the VAERS web site at www.vaers.hhs.gov, or by calling 1-800-822-7967.  VAERS does not give medical advice.  6. The National Vaccine Injury Compensation Program  The National Vaccine Injury Compensation Program (VICP) is a federal program that was created to compensate people who may have been injured by certain vaccines.  Persons who believe they may have been injured by a vaccine can learn about the program and about filing a claim by calling 1-800-338-2382 or visiting the VICP website at www.hrsa.gov/vaccinecompensation. There is a time limit to file a claim for compensation.  7. How can I learn more?  · Ask your doctor. He or she can give you the vaccine package insert or suggest other sources of information.  · Call your local or state health department.  · Contact the Centers for Disease Control and Prevention (CDC):  ? Call 1-800-232-4636 (1-800-CDC-INFO) or  ? Visit CDC's website at www.cdc.gov/vaccines  Vaccine Information Statement Tdap Vaccine (10/12/2013)  This information is not intended to replace advice given to you  by your health care provider. Make sure you discuss any questions you have with your health care provider.  Document Released: 02/04/2012 Document Revised: 03/23/2018 Document Reviewed: 03/23/2018  Elsevier Interactive Patient Education © 2019 Elsevier Inc.

## 2018-11-10 ENCOUNTER — Encounter: Payer: Self-pay | Admitting: *Deleted

## 2018-11-16 ENCOUNTER — Other Ambulatory Visit: Payer: Self-pay | Admitting: *Deleted

## 2018-11-16 DIAGNOSIS — Z3491 Encounter for supervision of normal pregnancy, unspecified, first trimester: Secondary | ICD-10-CM

## 2018-11-18 ENCOUNTER — Telehealth: Payer: Self-pay | Admitting: *Deleted

## 2018-11-18 NOTE — Telephone Encounter (Signed)
Emagene left a message this pm she has appointment tomorrow and has a question.  I called [place] and got her voice mail. I left a message I am returning her call- we are looking forward to seeing you for your appt- I reviewed time and to be fasting for labs- call back before 4 today or send MyChart message.

## 2018-11-19 ENCOUNTER — Ambulatory Visit (INDEPENDENT_AMBULATORY_CARE_PROVIDER_SITE_OTHER): Payer: Medicaid Other | Admitting: Obstetrics and Gynecology

## 2018-11-19 ENCOUNTER — Other Ambulatory Visit: Payer: Medicaid Other

## 2018-11-19 ENCOUNTER — Other Ambulatory Visit: Payer: Self-pay

## 2018-11-19 VITALS — BP 110/62 | HR 105 | Temp 98.4°F | Wt 186.0 lb

## 2018-11-19 DIAGNOSIS — Z3493 Encounter for supervision of normal pregnancy, unspecified, third trimester: Secondary | ICD-10-CM

## 2018-11-19 DIAGNOSIS — Z3A28 28 weeks gestation of pregnancy: Secondary | ICD-10-CM | POA: Diagnosis not present

## 2018-11-19 DIAGNOSIS — Z23 Encounter for immunization: Secondary | ICD-10-CM | POA: Diagnosis not present

## 2018-11-19 DIAGNOSIS — Z3491 Encounter for supervision of normal pregnancy, unspecified, first trimester: Secondary | ICD-10-CM

## 2018-11-19 NOTE — Progress Notes (Signed)
   PRENATAL VISIT NOTE  Subjective:  Jennifer Sanchez is a 23 y.o. G1P0 at [redacted]w[redacted]d being seen today for ongoing prenatal care.  She is currently monitored for the following issues for this low-risk pregnancy and has Supervision of low-risk pregnancy on their problem list.  Patient reports no complaints.  Contractions: Not present. Vag. Bleeding: None.  Movement: Present. Denies leaking of fluid.   The following portions of the patient's history were reviewed and updated as appropriate: allergies, current medications, past family history, past medical history, past social history, past surgical history and problem list.   Objective:   Vitals:   11/19/18 0833  BP: 110/62  Pulse: (!) 105  Temp: 98.4 F (36.9 C)  Weight: 186 lb (84.4 kg)    Fetal Status: Fetal Heart Rate (bpm): 154 Fundal Height: 28 cm Movement: Present     General:  Alert, oriented and cooperative. Patient is in no acute distress.  Skin: Skin is warm and dry. No rash noted.   Cardiovascular: Normal heart rate noted  Respiratory: Normal respiratory effort, no problems with respiration noted  Abdomen: Soft, gravid, appropriate for gestational age.  Pain/Pressure: Present     Pelvic: Cervical exam deferred        Extremities: Normal range of motion.  Edema: Trace  Mental Status: Normal mood and affect. Normal behavior. Normal judgment and thought content.   Assessment and Plan:  Pregnancy: G1P0 at [redacted]w[redacted]d  1. Encounter for supervision of low-risk pregnancy in first trimester  - Tdap vaccine greater than or equal to 7yo IM - Urine Culture -  Patient on baby scripts.  - 2 hour GTT today   Term labor symptoms and general obstetric precautions including but not limited to vaginal bleeding, contractions, leaking of fluid and fetal movement were reviewed in detail with the patient. Please refer to After Visit Summary for other counseling recommendations.   No follow-ups on file.  Future Appointments  Date Time  Provider Department Center  12/07/2018 10:45 AM WH[facility] NURSE [facility] MFC-US  12/07/2018 10:45 AM [facility] [place] 2 [facility] M[facility]-US  12/10/2018  8:15 AM , Harolyn Rutherford, NP [facility] W[facility]    Venia Carbon, NP

## 2018-11-20 LAB — CBC
Hematocrit: 34.6 % (ref 34.0–46.6)
Hemoglobin: 11.6 g/dL (ref 11.1–15.9)
MCH: 32 pg (ref 26.6–33.0)
MCHC: 33.5 g/dL (ref 31.5–35.7)
MCV: 95 fL (ref 79–97)
Platelets: 294 10*3/uL (ref 150–450)
RBC: 3.63 x10E6/uL — ABNORMAL LOW (ref 3.77–5.28)
RDW: 12.1 % (ref 11.7–15.4)
WBC: 10.4 10*3/uL (ref 3.4–10.8)

## 2018-11-20 LAB — RPR: RPR Ser Ql: NONREACTIVE

## 2018-11-20 LAB — GLUCOSE TOLERANCE, 2 HOURS W/ 1HR
Glucose, 1 hour: 121 mg/dL (ref 65–179)
Glucose, 2 hour: 102 mg/dL (ref 65–152)
Glucose, Fasting: 85 mg/dL (ref 65–91)

## 2018-11-20 LAB — HIV ANTIBODY (ROUTINE TESTING W REFLEX): HIV Screen 4th Generation wRfx: NONREACTIVE

## 2018-11-20 LAB — URINE CULTURE

## 2018-12-07 ENCOUNTER — Other Ambulatory Visit: Payer: Self-pay

## 2018-12-07 ENCOUNTER — Encounter (HOSPITAL_COMMUNITY): Payer: Self-pay

## 2018-12-07 ENCOUNTER — Ambulatory Visit (HOSPITAL_COMMUNITY)
Admission: RE | Admit: 2018-12-07 | Discharge: 2018-12-07 | Disposition: A | Discharge status: Home / Self Care | Payer: Medicaid Other | Source: Ambulatory Visit | Attending: Obstetrics and Gynecology | Admitting: Obstetrics and Gynecology

## 2018-12-07 ENCOUNTER — Ambulatory Visit (HOSPITAL_COMMUNITY): Payer: Medicaid Other | Admitting: *Deleted

## 2018-12-07 VITALS — Temp 98.7°F

## 2018-12-07 DIAGNOSIS — O359XX0 Maternal care for (suspected) fetal abnormality and damage, unspecified, not applicable or unspecified: Secondary | ICD-10-CM | POA: Diagnosis present

## 2018-12-07 DIAGNOSIS — Z362 Encounter for other antenatal screening follow-up: Secondary | ICD-10-CM | POA: Diagnosis not present

## 2018-12-07 DIAGNOSIS — Z3A31 31 weeks gestation of pregnancy: Secondary | ICD-10-CM

## 2018-12-10 ENCOUNTER — Ambulatory Visit (INDEPENDENT_AMBULATORY_CARE_PROVIDER_SITE_OTHER): Payer: Medicaid Other

## 2018-12-10 VITALS — BP 128/67 | HR 87

## 2018-12-10 DIAGNOSIS — Z3491 Encounter for supervision of normal pregnancy, unspecified, first trimester: Secondary | ICD-10-CM

## 2018-12-10 DIAGNOSIS — Z3A31 31 weeks gestation of pregnancy: Secondary | ICD-10-CM

## 2018-12-10 NOTE — Patient Instructions (Signed)

## 2018-12-10 NOTE — Progress Notes (Signed)
   TELEHEALTH VIRTUAL OBSTETRICS VISIT ENCOUNTER NOTE  I connected with Dori Harris-Sabra on 12/10/18 at  8:15 AM EDT by telephone at home and verified that I am speaking with the correct person using two identifiers.   I discussed the limitations, risks, security and privacy concerns of performing an evaluation and management service by telephone and the availability of in person appointments. I also discussed with the patient that there may be a patient responsible charge related to this service. The patient expressed understanding and agreed to proceed.  Subjective:  Jennifer Sanchez is a 23 y.o. G1P0 at [redacted]w[redacted]d being followed for ongoing prenatal care.  She is currently monitored for the following issues for this low-risk pregnancy and has Supervision of low-risk pregnancy on their problem list.  Patient reports leg cramps. She reports they happen randomly and frequently at night. Reports drinking "lots of water." Reports fetal movement. Denies any contractions, bleeding or leaking of fluid.   The following portions of the patient's history were reviewed and updated as appropriate: allergies, current medications, past family history, past medical history, past social history, past surgical history and problem list.   Objective:   General:  Alert, oriented and cooperative.   Mental Status: Normal mood and affect perceived. Normal judgment and thought content.  Rest of physical exam deferred due to type of encounter  Assessment and Plan:  Pregnancy: G1P0 at [redacted]w[redacted]d 1. Encounter for supervision of low-risk pregnancy in first trimester -BP 126/87 -Lengthy discussion of leg cramps in pregnancy and comfort measures. -U/S reviewed with patient and reassurance of normalcy provided.  -Anticipatory guidance for next visit reviewed. GBS testing and treatment reviewed.   Preterm labor symptoms and general obstetric precautions including but not limited to vaginal bleeding, contractions,  leaking of fluid and fetal movement were reviewed in detail with the patient.  I discussed the assessment and treatment plan with the patient. The patient was provided an opportunity to ask questions and all were answered. The patient agreed with the plan and demonstrated an understanding of the instructions. The patient was advised to call back or seek an in-person office evaluation/go to MAU at [facility] for any urgent or concerning symptoms. Please refer to After Visit Summary for other counseling recommendations.   I provided 11 minutes of non-face-to-face time during this encounter.  Return in about 4 weeks (around 01/07/2019) for Return OB visit with GBS/cultures.  Rolm Bookbinder, CNM [facility] [employer], [facility]

## 2018-12-10 NOTE — Progress Notes (Signed)
I connected with  Milus Glazier on 12/10/18 at  8:15 AM EDT by telephone and verified that I am speaking with the correct person using two identifiers.   I discussed the limitations, risks, security and privacy concerns of performing an evaluation and management service by telephone and the availability of in person appointments. I also discussed with the patient that there may be a patient responsible charge related to this service. The patient expressed understanding and agreed to proceed.  Janene Madeira Wm Sahagun, CMA 12/10/2018  8:24 AM   Pt will do the telephone visit today and took bp wile on phone it was 128/67. Pt having muscle spasms in her legs, constantly like her legs are giving out.

## 2019-01-06 ENCOUNTER — Telehealth: Payer: Self-pay | Admitting: Obstetrics & Gynecology

## 2019-01-06 NOTE — Telephone Encounter (Signed)
Called the patient to inform of upcoming visit, left a detailed voicemail of appointment info and new location address. °

## 2019-01-07 ENCOUNTER — Ambulatory Visit (INDEPENDENT_AMBULATORY_CARE_PROVIDER_SITE_OTHER): Payer: Medicaid Other | Admitting: Obstetrics and Gynecology

## 2019-01-07 ENCOUNTER — Other Ambulatory Visit: Payer: Self-pay

## 2019-01-07 ENCOUNTER — Other Ambulatory Visit (HOSPITAL_COMMUNITY)
Admission: RE | Admit: 2019-01-07 | Discharge: 2019-01-07 | Disposition: A | Discharge status: Home / Self Care | Payer: Medicaid Other | Source: Ambulatory Visit | Attending: Obstetrics and Gynecology | Admitting: Obstetrics and Gynecology

## 2019-01-07 VITALS — BP 108/68 | HR 82 | Temp 98.0°F | Wt 199.8 lb

## 2019-01-07 DIAGNOSIS — Z3493 Encounter for supervision of normal pregnancy, unspecified, third trimester: Secondary | ICD-10-CM | POA: Diagnosis present

## 2019-01-07 DIAGNOSIS — Z3A35 35 weeks gestation of pregnancy: Secondary | ICD-10-CM

## 2019-01-07 NOTE — Progress Notes (Signed)
Prenatal Visit Note Date: 01/07/2019 Clinic: [facility]  Subjective:  Jennifer Sanchez is a 23 y.o. G1P0 at [redacted]w[redacted]d being seen today for ongoing prenatal care.  She is currently monitored for the following issues for this low-risk pregnancy and has Supervision of low-risk pregnancy on their problem list.  Patient reports no complaints.   Contractions: Not present. Vag. Bleeding: None.  Movement: Present. Denies leaking of fluid.   The following portions of the patient's history were reviewed and updated as appropriate: allergies, current medications, past family history, past medical history, past social history, past surgical history and problem list. Problem list updated.  Objective:   Vitals:   01/07/19 1115  BP: 108/68  Pulse: 82  Temp: 98 F (36.7 C)  Weight: 199 lb 12.8 oz (90.6 kg)    Fetal Status: Fetal Heart Rate (bpm): 141 Fundal Height: 37 cm Movement: Present  Presentation: Vertex  General:  Alert, oriented and cooperative. Patient is in no acute distress.  Skin: Skin is warm and dry. No rash noted.   Cardiovascular: Normal heart rate noted  Respiratory: Normal respiratory effort, no problems with respiration noted  Abdomen: Soft, gravid, appropriate for gestational age. Pain/Pressure: Present     Pelvic:  Cervical exam deferred        Extremities: Normal range of motion.  Edema: Trace  Mental Status: Normal mood and affect. Normal behavior. Normal judgment and thought content.   Urinalysis:      Assessment and Plan:  Pregnancy: G1P0 at [redacted]w[redacted]d  1. Encounter for supervision of low-risk pregnancy in third trimester Routine care - GC/Chlamydia probe amp ([facility])not at [facility] - Strep Gp B Culture+Rflx  Preterm labor symptoms and general obstetric precautions including but not limited to vaginal bleeding, contractions, leaking of fluid and fetal movement were reviewed in detail with the patient. Please refer to After Visit Summary for  other counseling recommendations.  Return in about 1 week (around 01/14/2019) for 7-10d virtual rob.    Bing, MD

## 2019-01-08 LAB — GC/CHLAMYDIA PROBE AMP (~~LOC~~) NOT AT ARMC
Chlamydia: NEGATIVE
Neisseria Gonorrhea: NEGATIVE

## 2019-01-11 LAB — STREP GP B CULTURE+RFLX: Strep Gp B Culture+Rflx: NEGATIVE

## 2019-01-12 ENCOUNTER — Inpatient Hospital Stay (HOSPITAL_COMMUNITY)
Admission: AD | Admit: 2019-01-12 | Discharge: 2019-01-13 | Disposition: A | Discharge status: Home / Self Care | Payer: Medicaid Other | Attending: Family Medicine | Admitting: Family Medicine

## 2019-01-12 ENCOUNTER — Other Ambulatory Visit: Payer: Self-pay

## 2019-01-12 ENCOUNTER — Encounter (HOSPITAL_COMMUNITY): Payer: Self-pay | Admitting: *Deleted

## 2019-01-12 DIAGNOSIS — O26893 Other specified pregnancy related conditions, third trimester: Secondary | ICD-10-CM | POA: Insufficient documentation

## 2019-01-12 DIAGNOSIS — O26892 Other specified pregnancy related conditions, second trimester: Secondary | ICD-10-CM | POA: Diagnosis not present

## 2019-01-12 DIAGNOSIS — M545 Low back pain: Secondary | ICD-10-CM | POA: Insufficient documentation

## 2019-01-12 DIAGNOSIS — Z3A36 36 weeks gestation of pregnancy: Secondary | ICD-10-CM | POA: Insufficient documentation

## 2019-01-12 DIAGNOSIS — R102 Pelvic and perineal pain: Secondary | ICD-10-CM | POA: Diagnosis not present

## 2019-01-12 DIAGNOSIS — R109 Unspecified abdominal pain: Secondary | ICD-10-CM

## 2019-01-12 NOTE — MAU Note (Signed)
PT SAYS HAS LOWER ABD AND LOWER BACK PAIN- AND FEELS LIKE VAGINA IS STRETCHING - STARTED EARLY TODAY.     VAGINA IS TENDER/ SWOLLEN AND RED.   LAST SEX-   April. [facility] WITH CLINIC . NO VE

## 2019-01-12 NOTE — MAU Provider Note (Signed)
Chief Complaint:  Abdominal Pain   First Provider Initiated Contact with Patient 01/12/19 2344     HPI: Jennifer Sanchez is a 23 y.o. G1P0 at [redacted]w[redacted]d who presents to maternity admissions reporting abdominal & vaginal pressure. Symptoms started while working today. Reports feeling contractions about every 30 minutes. Also feels like her labia are swollen & irritated. Has noticed an increase in white discharge. Denies dysuria, vaginal bleeding, or LOF. Normal fetal movement.   Location: abdomen Quality: pressure Severity: 8/10 in pain scale Duration: <1 day Timing: 2x per hour Modifying factors: none Associated signs and symptoms: vaginal irritation  Past Medical History:  Diagnosis Date  . Medical history non-contributory    OB History  Gravida Para Term Preterm AB Living  1         0  SAB TAB Ectopic Multiple Live Births               # Outcome Date GA Lbr Len/2nd Weight Sex Delivery Anes PTL Lv  1 Current            Past Surgical History:  Procedure Laterality Date  . WISDOM TOOTH EXTRACTION  2015   Family History  Problem Relation Age of Onset  . Lupus Mother   . Atrial fibrillation Mother   . Endometriosis Sister   . Hypertension Sister    Social History   Tobacco Use  . Smoking status: Never Smoker  . Smokeless tobacco: Never Used  Substance Use Topics  . Alcohol use: No  . Drug use: No   Allergies  Allergen Reactions  . Aspirin Shortness Of Breath  . Penicillins Anaphylaxis  . Betadine [Povidone Iodine] Hives  . Iodine Hives  . Shellfish Allergy     hives   No medications prior to admission.    I have reviewed patient's Past Medical Hx, Surgical Hx, Family Hx, Social Hx, medications and allergies.   ROS:  Review of Systems  Constitutional: Negative.   Gastrointestinal: Positive for abdominal pain.  Genitourinary: Positive for vaginal discharge and vaginal pain. Negative for dysuria and vaginal bleeding.    Physical Exam   Patient Vitals for  the past 24 hrs:  BP Temp Temp src Pulse Resp Height Weight  01/13/19 0021 120/68 - - 78 - - -  01/12/19 2356 122/71 - - 77 - - -  01/12/19 2313 108/63 99.1 F (37.3 C) Oral 86 20 5' 6.75" (1.695 m) 92.2 kg    Constitutional: Well-developed, well-nourished female in no acute distress.  Cardiovascular: normal rate & rhythm, no murmur Respiratory: normal effort, lung sounds clear throughout GI: Abd soft, non-tender, gravid appropriate for gestational age. Pos BS x 4 MS: Extremities nontender, no edema, normal ROM Neurologic: Alert and oriented x 4.  GU:      Pelvic: NEFG, mild edema of bilateral vulva, no lesions or erythema. Small amount of thin yellow discharge.   Dilation: 1 Effacement (%): 50 Station: -3 Presentation: Vertex Exam by:: Judeth Horn, NP  NST:  Baseline: 135 bpm, Variability: Good {> 6 bpm), Accelerations: Reactive and Decelerations: Absent   Labs: Results for orders placed or performed during the hospital encounter of 01/12/19 (from the past 24 hour(s))  Wet prep, genital     Status: Abnormal   Collection Time: 01/12/19 11:41 PM  Result Value Ref Range   Yeast Wet Prep HPF POC NONE SEEN NONE SEEN   Trich, Wet Prep NONE SEEN NONE SEEN   Clue Cells Wet Prep HPF POC NONE SEEN NONE SEEN  WBC, Wet Prep HPF POC MANY (A) NONE SEEN   Sperm NONE SEEN     Imaging:  No results found.  MAU Course: Orders Placed This Encounter  Procedures  . Wet prep, genital  . Discharge patient   No orders of the defined types were placed in this encounter.   MDM: Reactive tracing. Cervix 1/thick/posterior Pt had negative GC/CT in office last week.  Wet prep negative tonight.   Assessment: 1. Abdominal pain during pregnancy in third trimester   2. [redacted] weeks gestation of pregnancy     Plan: Discharge home in stable condition.  Labor precautions and fetal kick counts   Allergies as of 01/13/2019      Reactions   Aspirin Shortness Of Breath   Penicillins Anaphylaxis    Betadine [povidone Iodine] Hives   Iodine Hives   Shellfish Allergy    hives      Medication List    TAKE these medications   multivitamin-prenatal 27-0.8 MG Tabs tablet Take 1 tablet by mouth daily at 12 noon.       Judeth HornLawrence, Tressia Labrum, NP 01/13/2019 8:11 AM

## 2019-01-13 LAB — WET PREP, GENITAL
Clue Cells Wet Prep HPF POC: NONE SEEN
Sperm: NONE SEEN
Trich, Wet Prep: NONE SEEN
Yeast Wet Prep HPF POC: NONE SEEN

## 2019-01-13 NOTE — Discharge Instructions (Signed)

## 2019-01-14 ENCOUNTER — Encounter: Payer: Self-pay | Admitting: Obstetrics and Gynecology

## 2019-01-14 ENCOUNTER — Encounter: Payer: Self-pay | Admitting: General Practice

## 2019-01-14 ENCOUNTER — Telehealth (INDEPENDENT_AMBULATORY_CARE_PROVIDER_SITE_OTHER): Payer: Medicaid Other | Admitting: Obstetrics and Gynecology

## 2019-01-14 ENCOUNTER — Other Ambulatory Visit: Payer: Self-pay

## 2019-01-14 VITALS — BP 114/61 | HR 101

## 2019-01-14 DIAGNOSIS — Z3493 Encounter for supervision of normal pregnancy, unspecified, third trimester: Secondary | ICD-10-CM

## 2019-01-14 DIAGNOSIS — Z3A36 36 weeks gestation of pregnancy: Secondary | ICD-10-CM

## 2019-01-14 NOTE — Progress Notes (Signed)
I connected with  Jennifer Sanchez on 01/14/19 at  2:35 PM EDT by telephone and verified that I am speaking with the correct person using two identifiers.   I discussed the limitations, risks, security and privacy concerns of performing an evaluation and management service by telephone and the availability of in person appointments. I also discussed with the patient that there may be a patient responsible charge related to this service. The patient expressed understanding and agreed to proceed.  Marylynn Pearson, RN 01/14/2019  3:08 PM

## 2019-01-14 NOTE — Progress Notes (Signed)
   TELEHEALTH OBSTETRICS PRENATAL VIRTUAL VIDEO VISIT ENCOUNTER NOTE  Provider location: [facility] [employer] [facility]   I connected with Jennifer Sanchez on 01/14/19 at  2:35 PM EDT by MyChart Video Encounter at home and verified that I am speaking with the correct person using two identifiers.   I discussed the limitations, risks, security and privacy concerns of performing an evaluation and management service by telephone and the availability of in person appointments. I also discussed with the patient that there may be a patient responsible charge related to this service. The patient expressed understanding and agreed to proceed. Subjective:  Jennifer Sanchez is a 23 y.o. G1P0 at [redacted]w[redacted]d being seen today for ongoing prenatal care.  She is currently monitored for the following issues for this low-risk pregnancy and has Supervision of low-risk pregnancy on their problem list.  Patient reports no complaints.  Contractions: Irritability. Vag. Bleeding: Scant.  Movement: Present. Denies any leaking of fluid.   The following portions of the patient's history were reviewed and updated as appropriate: allergies, current medications, past family history, past medical history, past social history, past surgical history and problem list.   Objective:   Vitals:   01/14/19 1510  BP: 114/61  Pulse: (!) 101    Fetal Status:     Movement: Present     General:  Alert, oriented and cooperative. Patient is in no acute distress.  Respiratory: Normal respiratory effort, no problems with respiration noted  Mental Status: Normal mood and affect. Normal behavior. Normal judgment and thought content.  Rest of physical exam deferred due to type of encounter  Imaging: No results found.  Assessment and Plan:  Pregnancy: G1P0 at [redacted]w[redacted]d  1. Encounter for supervision of low-risk pregnancy in third trimester Nexplanon post partum in hospital  Preterm labor symptoms and general obstetric  precautions including but not limited to vaginal bleeding, contractions, leaking of fluid and fetal movement were reviewed in detail with the patient. I discussed the assessment and treatment plan with the patient. The patient was provided an opportunity to ask questions and all were answered. The patient agreed with the plan and demonstrated an understanding of the instructions. The patient was advised to call back or seek an in-person office evaluation/go to MAU at [facility] for any urgent or concerning symptoms. Please refer to After Visit Summary for other counseling recommendations.   I provided 16 minutes of face-to-face time during this encounter.  Return in about 1 week (around 01/21/2019) for OB visit, virtual.  No future appointments.  Conan Bowens, MD Center for [facility], [facility]

## 2019-01-20 ENCOUNTER — Telehealth: Payer: Self-pay | Admitting: Nurse Practitioner

## 2019-01-20 NOTE — Telephone Encounter (Signed)
Called the patient to confirm the appointment tomorrow. Left a detailed voicemail message.

## 2019-01-21 ENCOUNTER — Telehealth (INDEPENDENT_AMBULATORY_CARE_PROVIDER_SITE_OTHER): Payer: Medicaid Other | Admitting: Obstetrics and Gynecology

## 2019-01-21 DIAGNOSIS — Z3009 Encounter for other general counseling and advice on contraception: Secondary | ICD-10-CM

## 2019-01-21 DIAGNOSIS — Z3493 Encounter for supervision of normal pregnancy, unspecified, third trimester: Secondary | ICD-10-CM

## 2019-01-21 DIAGNOSIS — Z3A37 37 weeks gestation of pregnancy: Secondary | ICD-10-CM

## 2019-01-21 NOTE — Progress Notes (Signed)
   TELEHEALTH VIRTUAL OBSTETRICS VISIT ENCOUNTER NOTE  I connected with Jennifer Sanchez on 01/21/19 at  8:35 AM EDT by mychart video at home and verified that I am speaking with the correct person using two identifiers.   I discussed the limitations, risks, security and privacy concerns of performing an evaluation and management service by telephone and the availability of in person appointments. I also discussed with the patient that there may be a patient responsible charge related to this service. The patient expressed understanding and agreed to proceed.  Subjective:  Jennifer Sanchez is a 23 y.o. G1P0 at [redacted]w[redacted]d being followed for ongoing prenatal care.  She is currently monitored for the following issues for this low-risk pregnancy and has Supervision of low-risk pregnancy and Counseling for initiation of birth control method on their problem list.  Patient reports no complaints. Reports fetal movement. Denies any contractions, bleeding or leaking of fluid.   The following portions of the patient's history were reviewed and updated as appropriate: allergies, current medications, past family history, past medical history, past social history, past surgical history and problem list.   Objective:   General:  Alert, oriented and cooperative.   Mental Status: Normal mood and affect perceived. Normal judgment and thought content.  Rest of physical exam deferred due to type of encounter  Assessment and Plan:  Pregnancy: G1P0 at [redacted]w[redacted]d  1. Encounter for supervision of low-risk pregnancy in third trimester  Doing well    2. Counseling for initiation of birth control method  Patient wants nexplanon postpartum    Term labor symptoms and general obstetric precautions including but not limited to vaginal bleeding, contractions, leaking of fluid and fetal movement were reviewed in detail with the patient.  I discussed the assessment and treatment plan with the patient. The patient was  provided an opportunity to ask questions and all were answered. The patient agreed with the plan and demonstrated an understanding of the instructions. The patient was advised to call back or seek an in-person office evaluation/go to MAU at [facility] for any urgent or concerning symptoms. Please refer to After Visit Summary for other counseling recommendations.   I provided 12 minutes of non-face-to-face time during this encounter.  Return in about 1 week (around 01/28/2019) for Virtual visit with me please .  Future Appointments  Date Time Provider Department Center  01/28/2019  9:15 AM Lucca Greggs, Harolyn Rutherford, NP [facility]  02/01/2019  4:15 PM Adam Phenix, MD [facility]    Venia Carbon, NP Center for [facility], [facility]

## 2019-01-21 NOTE — Patient Instructions (Signed)
Primrose oil. Take by mouth 3x per day and vaginally at night.  You can also google [employer] and try that to position baby.

## 2019-01-23 ENCOUNTER — Inpatient Hospital Stay (HOSPITAL_COMMUNITY)
Admission: AD | Admit: 2019-01-23 | Discharge: 2019-01-23 | Disposition: A | Discharge status: Home / Self Care | Payer: Medicaid Other | Attending: Obstetrics and Gynecology | Admitting: Obstetrics and Gynecology

## 2019-01-23 ENCOUNTER — Encounter (HOSPITAL_COMMUNITY): Payer: Self-pay | Admitting: *Deleted

## 2019-01-23 ENCOUNTER — Other Ambulatory Visit: Payer: Self-pay

## 2019-01-23 DIAGNOSIS — O471 False labor at or after 37 completed weeks of gestation: Secondary | ICD-10-CM | POA: Insufficient documentation

## 2019-01-23 DIAGNOSIS — O368130 Decreased fetal movements, third trimester, not applicable or unspecified: Secondary | ICD-10-CM | POA: Diagnosis not present

## 2019-01-23 DIAGNOSIS — Z3689 Encounter for other specified antenatal screening: Secondary | ICD-10-CM | POA: Diagnosis not present

## 2019-01-23 DIAGNOSIS — Z3A38 38 weeks gestation of pregnancy: Secondary | ICD-10-CM | POA: Insufficient documentation

## 2019-01-23 DIAGNOSIS — Z880 Allergy status to penicillin: Secondary | ICD-10-CM | POA: Diagnosis not present

## 2019-01-23 DIAGNOSIS — O479 False labor, unspecified: Secondary | ICD-10-CM

## 2019-01-23 LAB — WET PREP, GENITAL
Clue Cells Wet Prep HPF POC: NONE SEEN
Sperm: NONE SEEN
Trich, Wet Prep: NONE SEEN
Yeast Wet Prep HPF POC: NONE SEEN

## 2019-01-23 LAB — POCT FERN TEST: POCT Fern Test: NEGATIVE

## 2019-01-23 NOTE — MAU Note (Signed)
Jennifer Sanchez is a 23 y.o. at [redacted]w[redacted]d here in MAU reporting: contractions since this morning, they are about every 10 minutes. States she has not felt baby move since about 0930, did kick counts and did not feel anything, tried a warm bath and shower. No bleeding, had some clear leaking yesterday but none today, unsure if ROM.  Onset of complaint: today  Pain score: 10/10  Vitals:   01/23/19 1250  BP: 121/75  Pulse: 95  Resp: 18  Temp: 98.5 F (36.9 C)  SpO2: 100%     FHT:150  Lab orders placed from triage:none

## 2019-01-23 NOTE — MAU Provider Note (Addendum)
History     CSN: [id number]  Arrival date and time: 01/23/19 1237   None     Chief Complaint  Patient presents with  . Contractions  . Decreased Fetal Movement   HPI   Jennifer Sanchez is 23 y.o. G1P0 female at 2561w0d who presents for decreased fetal movement, concern for ROM, and contractions.   Reports she has not felt baby move since 0930 this morning. She tried doing kick counts and didn't feel movement. She proceeded to take a warm bath and shower without noting movement. Since she has been in the MAU she has felt some slight movements.   Reports contractions started yesterday and have become stronger/more regular this AM. Occurring every 10 minutes. Denies abdominal occurring outside of contractions.   Unsure if she had ROM yesterday. Reports she had thick, clumpy discharge but also some leaking of watery type fluid. Today, the amount is much less significant and seems to be like normal discharge. Denies other symptoms associated with discharge such as pruritis, foul odor, vaginal bleeding.   OB History    Gravida  1   Para      Term      Preterm      AB      Living  0     SAB      TAB      Ectopic      Multiple      Live Births              Past Medical History:  Diagnosis Date  . Medical history non-contributory     Past Surgical History:  Procedure Laterality Date  . WISDOM TOOTH EXTRACTION  2015    Family History  Problem Relation Age of Onset  . Lupus Mother   . Atrial fibrillation Mother   . Endometriosis Sister   . Hypertension Sister     Social History   Tobacco Use  . Smoking status: Never Smoker  . Smokeless tobacco: Never Used  Substance Use Topics  . Alcohol use: No  . Drug use: No    Allergies:  Allergies  Allergen Reactions  . Aspirin Shortness Of Breath  . Penicillins Anaphylaxis  . Betadine [Povidone Iodine] Hives  . Iodine Hives  . Shellfish Allergy     hives    Medications Prior to Admission   Medication Sig Dispense Refill Last Dose  . Prenatal Vit-Fe Fumarate-FA (MULTIVITAMIN-PRENATAL) 27-0.8 MG TABS tablet Take 1 tablet by mouth daily at 12 noon.   Taking    Review of Systems  Constitutional: Negative for activity change, chills and fever.  Respiratory: Negative for cough and shortness of breath.   Cardiovascular: Negative for chest pain.  Gastrointestinal: Negative for abdominal pain, nausea and vomiting.  Genitourinary: Positive for pelvic pain and vaginal discharge. Negative for dysuria and vaginal bleeding.  Musculoskeletal: Negative for back pain.  Neurological: Negative for dizziness and light-headedness.   Physical Exam   Blood pressure 121/75, pulse 95, temperature 98.5 F (36.9 C), temperature source Oral, resp. rate 18, height 5\' 6"  (1.676 m), weight 89.1 kg, last menstrual period 05/02/2018, SpO2 100 %.  Physical Exam  Constitutional: She is oriented to person, place, and time. She appears well-developed and well-nourished. No distress.  Appears uncomfortable after having contraction.   HENT:  Head: Normocephalic and atraumatic.  Eyes: Conjunctivae and EOM are normal.  Neck: Normal range of motion. Neck supple.  Cardiovascular: Normal rate and regular rhythm.  Respiratory: Effort normal and breath sounds  normal.  GI: Soft. There is no abdominal tenderness. There is no rebound and no guarding.  Gravid.   Genitourinary:    Genitourinary Comments: No pooling noted on pelvic exam. Some thin, white discharge present likely consistent with physiological discharge of pregnancy.    Musculoskeletal: Normal range of motion.        General: No edema.  Neurological: She is alert and oriented to person, place, and time.  Skin: Skin is warm and dry. She is not diaphoretic.  Psychiatric: She has a normal mood and affect. Her behavior is normal.    Dilation: 2 Effacement (%): 50 Cervical Position: Posterior Station: -3 Exam by:: Dr. Juleen China   NST: 140 bpm,  moderate variability, +acels, no decels  Toco: Irregular   MAU Course  Procedures Results for orders placed or performed during the hospital encounter of 01/23/19 (from the past 24 hour(s))  Wet prep, genital     Status: Abnormal   Collection Time: 01/23/19  1:57 PM  Result Value Ref Range   Yeast Wet Prep HPF POC NONE SEEN NONE SEEN   Trich, Wet Prep NONE SEEN NONE SEEN   Clue Cells Wet Prep HPF POC NONE SEEN NONE SEEN   WBC, Wet Prep HPF POC FEW (A) NONE SEEN   Sperm NONE SEEN     MDM NST reactive and patient now feeling fetal movement. Contractions irregular on toco; however patient appears uncomfortable with contractions. Will recheck cervix in one hour to monitor for change.   Assessment and Plan   2:55 PM Care transferred to Jorje Guild, NP due to case in the OR.   Melina Schools 01/23/2019, 1:36 PM   Cervix unchanged while in MAU & pt having irregular contractions Reactive fetal tracing  A: 1. False labor   2. Decreased fetal movements in third trimester, single or unspecified fetus   3. NST (non-stress test) reactive   4. [redacted] weeks gestation of pregnancy    P: Discharge home Labor precautions & fetal movement form  Jorje Guild, NP

## 2019-01-23 NOTE — Discharge Instructions (Signed)
Fetal Movement Counts Patient Name: ________________________________________________ Patient Due Date: ____________________ What is a fetal movement count?  A fetal movement count is the number of times that you feel your baby move during a certain amount of time. This may also be called a fetal kick count. A fetal movement count is recommended for every pregnant woman. You may be asked to start counting fetal movements as early as week 28 of your pregnancy. Pay attention to when your baby is most active. You may notice your baby's sleep and wake cycles. You may also notice things that make your baby move more. You should do a fetal movement count:  When your baby is normally most active.  At the same time each day. A good time to count movements is while you are resting, after having something to eat and drink. How do I count fetal movements? 1. Find a quiet, comfortable area. Sit, or lie down on your side. 2. Write down the date, the start time and stop time, and the number of movements that you felt between those two times. Take this information with you to your health care visits. 3. For 2 hours, count kicks, flutters, swishes, rolls, and jabs. You should feel at least 10 movements during 2 hours. 4. You may stop counting after you have felt 10 movements. 5. If you do not feel 10 movements in 2 hours, have something to eat and drink. Then, keep resting and counting for 1 hour. If you feel at least 4 movements during that hour, you may stop counting. Contact a health care provider if:  You feel fewer than 4 movements in 2 hours.  Your baby is not moving like he or she usually does. Date: ____________ Start time: ____________ Stop time: ____________ Movements: ____________ Date: ____________ Start time: ____________ Stop time: ____________ Movements: ____________ Date: ____________ Start time: ____________ Stop time: ____________ Movements: ____________ Date: ____________ Start time:  ____________ Stop time: ____________ Movements: ____________ Date: ____________ Start time: ____________ Stop time: ____________ Movements: ____________ Date: ____________ Start time: ____________ Stop time: ____________ Movements: ____________ Date: ____________ Start time: ____________ Stop time: ____________ Movements: ____________ Date: ____________ Start time: ____________ Stop time: ____________ Movements: ____________ Date: ____________ Start time: ____________ Stop time: ____________ Movements: ____________ This information is not intended to replace advice given to you by your health care provider. Make sure you discuss any questions you have with your health care provider. Document Released: 09/04/2006 Document Revised: 04/03/2016 Document Reviewed: 09/14/2015 Elsevier Interactive Patient Education  2019 [employer] Braxton Hicks Contractions Contractions of the uterus can occur throughout pregnancy, but they are not always a sign that you are in labor. You may have practice contractions called Braxton Hicks contractions. These false labor contractions are sometimes confused with true labor. What are Braxton Hicks contractions? Braxton Hicks contractions are tightening movements that occur in the muscles of the uterus before labor. Unlike true labor contractions, these contractions do not result in opening (dilation) and thinning of the cervix. Toward the end of pregnancy (32-34 weeks), Braxton Hicks contractions can happen more often and may become stronger. These contractions are sometimes difficult to tell apart from true labor because they can be very uncomfortable. You should not feel embarrassed if you go to the hospital with false labor. Sometimes, the only way to tell if you are in true labor is for your health care provider to look for changes in the cervix. The health care provider will do a physical exam and may monitor your contractions. If   you are not in true labor, the exam  should show that your cervix is not dilating and your water has not broken. If there are no other health problems associated with your pregnancy, it is completely safe for you to be sent home with false labor. You may continue to have Braxton Hicks contractions until you go into true labor. How to tell the difference between true labor and false labor True labor  Contractions last 30-70 seconds.  Contractions become very regular.  Discomfort is usually felt in the top of the uterus, and it spreads to the lower abdomen and low back.  Contractions do not go away with walking.  Contractions usually become more intense and increase in frequency.  The cervix dilates and gets thinner. False labor  Contractions are usually shorter and not as strong as true labor contractions.  Contractions are usually irregular.  Contractions are often felt in the front of the lower abdomen and in the groin.  Contractions may go away when you walk around or change positions while lying down.  Contractions get weaker and are shorter-lasting as time goes on.  The cervix usually does not dilate or become thin. Follow these instructions at home:   Take over-the-counter and prescription medicines only as told by your health care provider.  Keep up with your usual exercises and follow other instructions from your health care provider.  Eat and drink lightly if you think you are going into labor.  If Braxton Hicks contractions are making you uncomfortable: ? Change your position from lying down or resting to walking, or change from walking to resting. ? Sit and rest in a tub of warm water. ? Drink enough fluid to keep your urine pale yellow. Dehydration may cause these contractions. ? Do slow and deep breathing several times an hour.  Keep all follow-up prenatal visits as told by your health care provider. This is important. Contact a health care provider if:  You have a fever.  You have continuous  pain in your abdomen. Get help right away if:  Your contractions become stronger, more regular, and closer together.  You have fluid leaking or gushing from your vagina.  You pass blood-tinged mucus (bloody show).  You have bleeding from your vagina.  You have low back pain that you never had before.  You feel your baby's head pushing down and causing pelvic pressure.  Your baby is not moving inside you as much as it used to. Summary  Contractions that occur before labor are called Braxton Hicks contractions, false labor, or practice contractions.  Braxton Hicks contractions are usually shorter, weaker, farther apart, and less regular than true labor contractions. True labor contractions usually become progressively stronger and regular, and they become more frequent.  Manage discomfort from Braxton Hicks contractions by changing position, resting in a warm bath, drinking plenty of water, or practicing deep breathing. This information is not intended to replace advice given to you by your health care provider. Make sure you discuss any questions you have with your health care provider. Document Released: 12/19/2016 Document Revised: 05/20/2017 Document Reviewed: 12/19/2016 Elsevier Interactive Patient Education  2019 [employer] Inc[employer]  

## 2019-01-25 LAB — GC/CHLAMYDIA PROBE AMP (~~LOC~~) NOT AT ARMC
Chlamydia: NEGATIVE
Neisseria Gonorrhea: NEGATIVE

## 2019-01-26 ENCOUNTER — Telehealth: Payer: Self-pay

## 2019-01-26 NOTE — Telephone Encounter (Signed)
Called the patient to confirm the upcoming appointment. The patient verbalized understanding and stated she is aware of how to operate mychart.

## 2019-01-28 ENCOUNTER — Inpatient Hospital Stay (HOSPITAL_COMMUNITY): Payer: Medicaid Other | Admitting: Anesthesiology

## 2019-01-28 ENCOUNTER — Inpatient Hospital Stay (HOSPITAL_COMMUNITY)
Admission: AD | Admit: 2019-01-28 | Discharge: 2019-01-30 | DRG: 807 | Disposition: A | Discharge status: Home / Self Care | Payer: Medicaid Other | Attending: Obstetrics and Gynecology | Admitting: Obstetrics and Gynecology

## 2019-01-28 ENCOUNTER — Telehealth: Payer: Medicaid Other | Admitting: Obstetrics and Gynecology

## 2019-01-28 ENCOUNTER — Encounter (HOSPITAL_COMMUNITY): Payer: Self-pay | Admitting: *Deleted

## 2019-01-28 DIAGNOSIS — Z30017 Encounter for initial prescription of implantable subdermal contraceptive: Secondary | ICD-10-CM | POA: Diagnosis not present

## 2019-01-28 DIAGNOSIS — Z3009 Encounter for other general counseling and advice on contraception: Secondary | ICD-10-CM | POA: Diagnosis present

## 2019-01-28 DIAGNOSIS — Z1159 Encounter for screening for other viral diseases: Secondary | ICD-10-CM

## 2019-01-28 DIAGNOSIS — Z3A38 38 weeks gestation of pregnancy: Secondary | ICD-10-CM

## 2019-01-28 DIAGNOSIS — O26893 Other specified pregnancy related conditions, third trimester: Secondary | ICD-10-CM | POA: Diagnosis present

## 2019-01-28 LAB — CBC
HCT: 37.1 % (ref 36.0–46.0)
Hemoglobin: 12.4 g/dL (ref 12.0–15.0)
MCH: 30.4 pg (ref 26.0–34.0)
MCHC: 33.4 g/dL (ref 30.0–36.0)
MCV: 90.9 fL (ref 80.0–100.0)
Platelets: 301 10*3/uL (ref 150–400)
RBC: 4.08 MIL/uL (ref 3.87–5.11)
RDW: 13.3 % (ref 11.5–15.5)
WBC: 9.6 10*3/uL (ref 4.0–10.5)
nRBC: 0 % (ref 0.0–0.2)

## 2019-01-28 LAB — SARS CORONAVIRUS 2: SARS Coronavirus 2: NOT DETECTED

## 2019-01-28 MED ORDER — EPHEDRINE 5 MG/ML INJ: 10.0000 mg | INTRAVENOUS | Status: DC | PRN

## 2019-01-28 MED ORDER — DOCUSATE SODIUM 100 MG PO CAPS
100.0000 mg | ORAL_CAPSULE | Freq: Two times a day (BID) | ORAL | Status: DC
  Administered 2019-01-28 – 2019-01-30 (×4): 100 mg via ORAL
  Filled 2019-01-28 (×4): qty 1

## 2019-01-28 MED ORDER — METHYLERGONOVINE MALEATE 0.2 MG/ML IJ SOLN: 0.2000 mg | INTRAMUSCULAR | Status: DC | PRN

## 2019-01-28 MED ORDER — ONDANSETRON HCL 4 MG PO TABS: 4.0000 mg | ORAL_TABLET | ORAL | Status: DC | PRN

## 2019-01-28 MED ORDER — OXYCODONE-ACETAMINOPHEN 5-325 MG PO TABS: 2.0000 | ORAL_TABLET | ORAL | Status: DC | PRN

## 2019-01-28 MED ORDER — ACETAMINOPHEN 325 MG PO TABS: 650.0000 mg | ORAL_TABLET | ORAL | Status: DC | PRN

## 2019-01-28 MED ORDER — DIPHENHYDRAMINE HCL 25 MG PO CAPS: 25.0000 mg | ORAL_CAPSULE | Freq: Four times a day (QID) | ORAL | Status: DC | PRN

## 2019-01-28 MED ORDER — WITCH HAZEL-GLYCERIN EX PADS: 1.0000 "application " | MEDICATED_PAD | CUTANEOUS | Status: DC | PRN

## 2019-01-28 MED ORDER — SODIUM CHLORIDE (PF) 0.9 % IJ SOLN
INTRAMUSCULAR | Status: DC | PRN
  Administered 2019-01-28: 14 mL/h via EPIDURAL

## 2019-01-28 MED ORDER — ONDANSETRON HCL 4 MG/2ML IJ SOLN: 4.0000 mg | Freq: Four times a day (QID) | INTRAMUSCULAR | Status: DC | PRN

## 2019-01-28 MED ORDER — IBUPROFEN 600 MG PO TABS
600.0000 mg | ORAL_TABLET | Freq: Four times a day (QID) | ORAL | Status: DC
  Administered 2019-01-29 – 2019-01-30 (×7): 600 mg via ORAL
  Filled 2019-01-28 (×7): qty 1

## 2019-01-28 MED ORDER — SIMETHICONE 80 MG PO CHEW: 80.0000 mg | CHEWABLE_TABLET | ORAL | Status: DC | PRN

## 2019-01-28 MED ORDER — ZOLPIDEM TARTRATE 5 MG PO TABS: 5.0000 mg | ORAL_TABLET | Freq: Every evening | ORAL | Status: DC | PRN

## 2019-01-28 MED ORDER — HYDROXYZINE HCL 50 MG PO TABS: 50.0000 mg | ORAL_TABLET | Freq: Four times a day (QID) | ORAL | Status: DC | PRN

## 2019-01-28 MED ORDER — LIDOCAINE HCL (PF) 1 % IJ SOLN: 30.0000 mL | INTRAMUSCULAR | Status: DC | PRN

## 2019-01-28 MED ORDER — FENTANYL-BUPIVACAINE-NACL 0.5-0.125-0.9 MG/250ML-% EP SOLN
12.0000 mL/h | EPIDURAL | Status: DC | PRN
  Filled 2019-01-28: qty 250

## 2019-01-28 MED ORDER — MORPHINE SULFATE (PF) 4 MG/ML IV SOLN
4.0000 mg | Freq: Once | INTRAVENOUS | Status: AC
  Administered 2019-01-28: 4 mg via INTRAMUSCULAR
  Filled 2019-01-28: qty 1

## 2019-01-28 MED ORDER — DIPHENHYDRAMINE HCL 50 MG/ML IJ SOLN: 12.5000 mg | INTRAMUSCULAR | Status: DC | PRN

## 2019-01-28 MED ORDER — COCONUT OIL OIL: 1.0000 "application " | TOPICAL_OIL | Status: DC | PRN

## 2019-01-28 MED ORDER — MEASLES, MUMPS & RUBELLA VAC IJ SOLR: 0.5000 mL | Freq: Once | INTRAMUSCULAR | Status: DC

## 2019-01-28 MED ORDER — BISACODYL 10 MG RE SUPP: 10.0000 mg | Freq: Every day | RECTAL | Status: DC | PRN

## 2019-01-28 MED ORDER — SOD CITRATE-CITRIC ACID 500-334 MG/5ML PO SOLN: 30.0000 mL | ORAL | Status: DC | PRN

## 2019-01-28 MED ORDER — FENTANYL CITRATE (PF) 100 MCG/2ML IJ SOLN
50.0000 ug | INTRAMUSCULAR | Status: DC | PRN
  Administered 2019-01-28: 100 ug via INTRAVENOUS
  Filled 2019-01-28: qty 2

## 2019-01-28 MED ORDER — LACTATED RINGERS IV SOLN: 500.0000 mL | Freq: Once | INTRAVENOUS | Status: DC

## 2019-01-28 MED ORDER — TETANUS-DIPHTH-ACELL PERTUSSIS 5-2.5-18.5 LF-MCG/0.5 IM SUSP: 0.5000 mL | Freq: Once | INTRAMUSCULAR | Status: DC

## 2019-01-28 MED ORDER — OXYCODONE-ACETAMINOPHEN 5-325 MG PO TABS: 1.0000 | ORAL_TABLET | ORAL | Status: DC | PRN

## 2019-01-28 MED ORDER — OXYTOCIN BOLUS FROM INFUSION
500.0000 mL | Freq: Once | INTRAVENOUS | Status: AC
  Administered 2019-01-28: 500 mL via INTRAVENOUS

## 2019-01-28 MED ORDER — DIBUCAINE (PERIANAL) 1 % EX OINT: 1.0000 "application " | TOPICAL_OINTMENT | CUTANEOUS | Status: DC | PRN

## 2019-01-28 MED ORDER — LIDOCAINE-EPINEPHRINE (PF) 2 %-1:200000 IJ SOLN
INTRAMUSCULAR | Status: DC | PRN
  Administered 2019-01-28 (×2): 3 mL via EPIDURAL

## 2019-01-28 MED ORDER — PROMETHAZINE HCL 25 MG/ML IJ SOLN
12.5000 mg | Freq: Once | INTRAMUSCULAR | Status: AC
  Administered 2019-01-28: 12.5 mg via INTRAMUSCULAR
  Filled 2019-01-28: qty 1

## 2019-01-28 MED ORDER — ONDANSETRON HCL 4 MG/2ML IJ SOLN: 4.0000 mg | INTRAMUSCULAR | Status: DC | PRN

## 2019-01-28 MED ORDER — METHYLERGONOVINE MALEATE 0.2 MG PO TABS: 0.2000 mg | ORAL_TABLET | ORAL | Status: DC | PRN

## 2019-01-28 MED ORDER — LACTATED RINGERS IV SOLN
INTRAVENOUS | Status: DC
  Administered 2019-01-28 (×2): via INTRAVENOUS

## 2019-01-28 MED ORDER — PRENATAL MULTIVITAMIN CH
1.0000 | ORAL_TABLET | Freq: Every day | ORAL | Status: DC
  Administered 2019-01-29 – 2019-01-30 (×2): 1 via ORAL
  Filled 2019-01-28 (×2): qty 1

## 2019-01-28 MED ORDER — ACETAMINOPHEN 325 MG PO TABS
650.0000 mg | ORAL_TABLET | ORAL | Status: DC | PRN
  Administered 2019-01-28 – 2019-01-29 (×2): 650 mg via ORAL
  Filled 2019-01-28 (×2): qty 2

## 2019-01-28 MED ORDER — LACTATED RINGERS IV SOLN: 500.0000 mL | INTRAVENOUS | Status: DC | PRN

## 2019-01-28 MED ORDER — BENZOCAINE-MENTHOL 20-0.5 % EX AERO: 1.0000 "application " | INHALATION_SPRAY | CUTANEOUS | Status: DC | PRN

## 2019-01-28 MED ORDER — PHENYLEPHRINE 40 MCG/ML (10ML) SYRINGE FOR IV PUSH (FOR BLOOD PRESSURE SUPPORT): 80.0000 ug | PREFILLED_SYRINGE | INTRAVENOUS | Status: DC | PRN

## 2019-01-28 MED ORDER — OXYTOCIN 40 UNITS IN NORMAL SALINE INFUSION - SIMPLE MED
2.5000 [IU]/h | INTRAVENOUS | Status: DC
  Filled 2019-01-28: qty 1000

## 2019-01-28 MED ORDER — LIDOCAINE HCL 1 % IJ SOLN
0.0000 mL | Freq: Once | INTRAMUSCULAR | Status: AC | PRN
  Administered 2019-01-29: 20 mL via INTRADERMAL
  Filled 2019-01-28 (×2): qty 20

## 2019-01-28 MED ORDER — ETONOGESTREL 68 MG ~~LOC~~ IMPL
68.0000 mg | DRUG_IMPLANT | Freq: Once | SUBCUTANEOUS | Status: AC
  Administered 2019-01-29: 68 mg via SUBCUTANEOUS
  Filled 2019-01-28: qty 1

## 2019-01-28 MED ORDER — FERROUS SULFATE 325 (65 FE) MG PO TABS
325.0000 mg | ORAL_TABLET | Freq: Two times a day (BID) | ORAL | Status: DC
  Administered 2019-01-29 – 2019-01-30 (×3): 325 mg via ORAL
  Filled 2019-01-28 (×3): qty 1

## 2019-01-28 MED ORDER — FLEET ENEMA 7-19 GM/118ML RE ENEM: 1.0000 | ENEMA | RECTAL | Status: DC | PRN

## 2019-01-28 MED ORDER — PHENYLEPHRINE 40 MCG/ML (10ML) SYRINGE FOR IV PUSH (FOR BLOOD PRESSURE SUPPORT)
80.0000 ug | PREFILLED_SYRINGE | INTRAVENOUS | Status: DC | PRN
  Filled 2019-01-28: qty 10

## 2019-01-28 MED ORDER — FLEET ENEMA 7-19 GM/118ML RE ENEM: 1.0000 | ENEMA | Freq: Every day | RECTAL | Status: DC | PRN

## 2019-01-28 NOTE — MAU Note (Signed)
Ctx about 4 min apart. Reports some bloody show denies any leaking at this time

## 2019-01-28 NOTE — Lactation Note (Signed)
This note was copied from a baby's chart. Lactation Consultation Note  Patient Name: Boy Shanele Nissan [id number] Date: 01/28/2019 Reason for consult: Early term 37-38.6wks;1st time breastfeeding;Primapara  P1 mother whose infant is now 64 hours old.    Baby was STS on mother's chest beginning to show feeding cues when I arrived.  Offered to assist with latching and mother accepted.  Taught hand expression and she was unable to obtain any colostrum drops at this time.  Colostrum container provided with instructions for use.  Milk storage times reviewed and finger feeding demonstrated.  Assisted baby to latch in the football hold on the left breast without difficulty.  He had a wide open gape with flanged lips. With gentle stimulation he began sucking.  Demonstrated breast compressions and discussed how to keep him awake during feedings.  With almost constant stimulation he continued to suck.  Mother felt a tug but denied pain with latching.  Observed him feeding for 8 minutes prior to my departure.    Encouraged mother to feed 8-12 times/24 hours or sooner if he shows feeding cues.  Reviewed feeding cues.  Suggested mother call for latch assistance as needed.  Mom made aware of O/P services, breastfeeding support groups, community resources, and our phone # for post-discharge questions. Mother will be a "stay at home" mother for a few months and then probably go to work.  She does not have a DEBP for home use.  Mother participates in the [facility] program in [place].  I informed her that she can obtain a pump from the [facility] office if she will be exclusively breast feeding.  Mother may want to also give formula so she will keep this option open for now.  Father present.   Maternal Data Formula Feeding for Exclusion: No Has patient been taught Hand Expression?: Yes Does the patient have breastfeeding experience prior to this delivery?: No  Feeding Feeding Type: Breast Fed  LATCH  Score Latch: Repeated attempts needed to sustain latch, nipple held in mouth throughout feeding, stimulation needed to elicit sucking reflex.  Audible Swallowing: None  Type of Nipple: Everted at rest and after stimulation  Comfort (Breast/Nipple): Soft / non-tender  Hold (Positioning): Assistance needed to correctly position infant at breast and maintain latch.  LATCH Score: 6  Interventions Interventions: Breast feeding basics reviewed;Assisted with latch;Skin to skin;Breast massage;Hand express;Adjust position;Position options;Support pillows  Lactation Tools Discussed/Used WIC Program: Yes   Consult Status Consult Status: Follow-up Date: 01/29/19 Follow-up type: In-patient    Little Ishikawa 01/28/2019, 8:43 PM

## 2019-01-28 NOTE — Anesthesia Preprocedure Evaluation (Signed)
Anesthesia Evaluation  Patient identified by MRN, date of birth, ID band Patient awake    Reviewed: Allergy & Precautions, NPO status , Patient's Chart, lab work & pertinent test results  Airway Mallampati: II  TM Distance: >3 FB Neck ROM: Full    Dental no notable dental hx.    Pulmonary neg pulmonary ROS,    Pulmonary exam normal breath sounds clear to auscultation       Cardiovascular negative cardio ROS Normal cardiovascular exam Rhythm:Regular Rate:Normal     Neuro/Psych negative neurological ROS  negative psych ROS   GI/Hepatic negative GI ROS, Neg liver ROS,   Endo/Other  negative endocrine ROS  Renal/GU negative Renal ROS  negative genitourinary   Musculoskeletal negative musculoskeletal ROS (+)   Abdominal   Peds negative pediatric ROS (+)  Hematology negative hematology ROS (+)   Anesthesia Other Findings   Reproductive/Obstetrics (+) Pregnancy                             Anesthesia Physical Anesthesia Plan  ASA: II  Anesthesia Plan: Epidural   Post-op Pain Management:    Induction:   PONV Risk Score and Plan: Treatment may vary due to age or medical condition  Airway Management Planned: Natural Airway  Additional Equipment:   Intra-op Plan:   Post-operative Plan:   Informed Consent: I have reviewed the patients History and Physical, chart, labs and discussed the procedure including the risks, benefits and alternatives for the proposed anesthesia with the patient or authorized representative who has indicated his/her understanding and acceptance.       Plan Discussed with: Anesthesiologist  Anesthesia Plan Comments: (Patient identified. Risks, benefits, options discussed with patient including but not limited to bleeding, infection, nerve damage, paralysis, failed block, incomplete pain control, headache, blood pressure changes, nausea, vomiting, reactions to  medication, itching, and post partum back pain. Confirmed with bedside nurse the patient's most recent platelet count. Confirmed with the patient that they are not taking any anticoagulation, have any bleeding history or any family history of bleeding disorders. Patient expressed understanding and wishes to proceed. All questions were answered. )        Anesthesia Quick Evaluation  

## 2019-01-28 NOTE — Anesthesia Procedure Notes (Signed)
Epidural Patient location during procedure: OB Start time: 01/28/2019 12:30 PM End time: 01/28/2019 12:45 PM  Staffing Anesthesiologist: Freddrick March, MD Performed: anesthesiologist   Preanesthetic Checklist Completed: patient identified, pre-op evaluation, timeout performed, IV checked, risks and benefits discussed and monitors and equipment checked  Epidural Patient position: sitting Prep: site prepped and draped and DuraPrep Patient monitoring: continuous pulse ox, blood pressure, heart rate and cardiac monitor Approach: midline Location: L3-L4 Injection technique: LOR air  Needle:  Needle type: Tuohy  Needle gauge: 17 G Needle length: 9 cm Needle insertion depth: 6 cm Catheter type: closed end flexible Catheter size: 19 Gauge Catheter at skin depth: 12 cm Test dose: negative  Assessment Sensory level: T8 Events: blood not aspirated, injection not painful, no injection resistance, negative IV test and no paresthesia  Additional Notes Patient identified. Risks/Benefits/Options discussed with patient including but not limited to bleeding, infection, nerve damage, paralysis, failed block, incomplete pain control, headache, blood pressure changes, nausea, vomiting, reactions to medication both or allergic, itching and postpartum back pain. Confirmed with bedside nurse the patient's most recent platelet count. Confirmed with patient that they are not currently taking any anticoagulation, have any bleeding history or any family history of bleeding disorders. Patient expressed understanding and wished to proceed. All questions were answered. Sterile technique was used throughout the entire procedure. Please see nursing notes for vital signs. Test dose was given through epidural catheter and negative prior to continuing to dose epidural or start infusion. Warning signs of high block given to the patient including shortness of breath, tingling/numbness in hands, complete motor block,  or any concerning symptoms with instructions to call for help. Patient was given instructions on fall risk and not to get out of bed. All questions and concerns addressed with instructions to call with any issues or inadequate analgesia.  Reason for block:procedure for pain

## 2019-01-28 NOTE — Discharge Summary (Signed)
Postpartum Discharge Summary     Patient Name: Jennifer Sanchez DOB: 1995/10/12 MRN: [id number]  Date of admission: 01/28/2019 Delivering Provider: Christin Fudge   Date of discharge: 01/30/2019  Admitting diagnosis: CTX Intrauterine pregnancy: [redacted]w[redacted]d     Secondary diagnosis:  Active Problems:   Counseling for initiation of birth control method   Normal labor and delivery  Additional problems: Normal labor     Discharge diagnosis: Term Pregnancy Delivered                                                                                                Post partum procedures:Nexplanon- placed prior to d/c  Augmentation: AROM  Complications: None  Hospital course:  Onset of Labor With Vaginal Delivery     23 y.o. yo G1P0 at [redacted]w[redacted]d was admitted in Active Labor on 01/28/2019. Patient had an uncomplicated labor course as follows:  Membrane Rupture Time/Date: 1:22 PM ,01/28/2019   Intrapartum Procedures: Episiotomy: None [1]                                         Lacerations:  None [1]  Patient had a delivery of a Viable infant. 01/28/2019  Information for the patient's newborn:  Iyahna, Obriant [[id number]]  Delivery Method: Vaginal, Spontaneous(Filed from Delivery Summary)     Pateint had an uncomplicated postpartum course.  Nexplanon was placed on PPD#1 (see procedure note). She is ambulating, tolerating a regular diet, passing flatus, and urinating well. Patient is discharged home in stable condition on 01/30/19.   Magnesium Sulfate recieved: No BMZ received: No  Physical exam  Vitals:   01/29/19 0512 01/29/19 1425 01/29/19 2159 01/30/19 0536  BP: 104/61 107/67 105/61 101/64  Pulse: 65 70 74 74  Resp: 18 18 18 18   Temp: 98.2 F (36.8 C) 98.1 F (36.7 C) 98.3 F (36.8 C) 98.8 F (37.1 C)  TempSrc: Oral Oral  Oral  SpO2:  99% 100% 100%  Weight:      Height:       General: alert and cooperative Lochia: appropriate Uterine Fundus:  firm Incision: N/A DVT Evaluation: No evidence of DVT seen on physical exam. Labs: Lab Results  Component Value Date   WBC 9.6 01/28/2019   HGB 12.4 01/28/2019   HCT 37.1 01/28/2019   MCV 90.9 01/28/2019   PLT 301 01/28/2019   CMP Latest Ref Rng & Units 06/16/2018  Glucose 70 - 99 mg/dL 94  BUN 6 - 20 mg/dL 8  Creatinine 0.44 - 1.00 mg/dL 0.64  Sodium 135 - 145 mmol/L 135  Potassium 3.5 - 5.1 mmol/L 3.3(L)  Chloride 98 - 111 mmol/L 102  CO2 22 - 32 mmol/L 22  Calcium 8.9 - 10.3 mg/dL 9.0    Discharge instruction: per After Visit Summary and "Baby and Me Booklet".  After visit meds:  Allergies as of 01/30/2019      Reactions   Aspirin Shortness Of Breath   Pt has tolerated Ibuprofen in the past   Penicillins Anaphylaxis  Betadine [povidone Iodine] Hives   Iodine Hives   Shellfish Allergy    hives      Medication List    TAKE these medications   ibuprofen 600 MG tablet Commonly known as: ADVIL Take 1 tablet (600 mg total) by mouth every 6 (six) hours as needed.   multivitamin-prenatal 27-0.8 MG Tabs tablet Take 1 tablet by mouth daily at 12 noon.       Diet: routine diet  Activity: Advance as tolerated. Pelvic rest for 6 weeks.   Outpatient follow up:4 weeks Follow up Appt:No future appointments. Follow up Visit: Follow-up Information    [facility] Schedule an appointment as soon as possible for a visit in 4 week(s).   Specialty: Obstetrics and Gynecology Why: for your postpartum appointment Contact information: [address] [id number] [place]c [place] [phone]           Please schedule this patient for Postpartum visit in: 4 weeks with the following provider: Venia CarbonJennifer Rasch For C/S patients schedule nurse incision check in weeks 2 weeks: no Low risk pregnancy complicated by:  Delivery mode:  SVD Anticipated Birth Control:  Nexplanon plans inpatient PP  Procedures needed:   Schedule Integrated BH visit: no   Newborn Data: Live born adult  Birth Weight:  3265gm (7lb 3.2oz) APGAR: 9, 9  Newborn Delivery   Birth date/time: 01/28/19, 1724 Delivery type: SVD      Baby Feeding: Bottle and Breast Disposition:home with mother   01/30/2019 Arabella MerlesKimberly D Prakash Kimberling, CNM  9:28 AM

## 2019-01-28 NOTE — H&P (Addendum)
Jennifer Sanchez is a 23 y.o. female G1P0 with IUP at 5446w5d presenting for contractions. Pt states she has been having regular, every 4 minutes contractions, associated with scant staining vaginal bleeding for several hours..  Membranes are intact, with active fetal movement.   [facility]  Prenatal History/Complications:  First baby   Past Medical History: Past Medical History:  Diagnosis Date  . Medical history non-contributory     Past Surgical History: Past Surgical History:  Procedure Laterality Date  . WISDOM TOOTH EXTRACTION  2015    Obstetrical History: OB History    Gravida  1   Para      Term      Preterm      AB      Living  0     SAB      TAB      Ectopic      Multiple      Live Births               Social History: Social History   Socioeconomic History  . Marital status: Single    Spouse name: Not on file  . Number of children: Not on file  . Years of education: 4214  . Highest education level: Some college, no degree  Occupational History  . Not on file  Social Needs  . Financial resource strain: Not on file  . Food insecurity    Worry: Not on file    Inability: Not on file  . Transportation needs    Medical: Not on file    Non-medical: Not on file  Tobacco Use  . Smoking status: Never Smoker  . Smokeless tobacco: Never Used  Substance and Sexual Activity  . Alcohol use: No  . Drug use: No  . Sexual activity: Yes    Birth control/protection: None  Lifestyle  . Physical activity    Days per week: Not on file    Minutes per session: Not on file  . Stress: Not on file  Relationships  . Social Musicianconnections    Talks on phone: Not on file    Gets together: Not on file    Attends religious service: Not on file    Active member of club or organization: Not on file    Attends meetings of clubs or organizations: Not on file    Relationship status: Not on file  Other Topics Concern  . Not on file  Social History  Narrative  . Not on file    Family History: Family History  Problem Relation Age of Onset  . Lupus Mother   . Atrial fibrillation Mother   . Endometriosis Sister   . Hypertension Sister     Allergies: Allergies  Allergen Reactions  . Aspirin Shortness Of Breath  . Penicillins Anaphylaxis  . Betadine [Povidone Iodine] Hives  . Iodine Hives  . Shellfish Allergy     hives    Medications Prior to Admission  Medication Sig Dispense Refill Last Dose  . Prenatal Vit-Fe Fumarate-FA (MULTIVITAMIN-PRENATAL) 27-0.8 MG TABS tablet Take 1 tablet by mouth daily at 12 noon.           Review of Systems   Constitutional: Negative for fever and chills Eyes: Negative for visual disturbances Respiratory: Negative for shortness of breath, dyspnea Cardiovascular: Negative for chest pain or palpitations  Gastrointestinal: Negative for vomiting, diarrhea and constipation.  POSITIVE for abdominal pain (contractions) Genitourinary: Negative for dysuria and urgency Musculoskeletal: Negative for back pain, joint  pain, myalgias  Neurological: Negative for dizziness and headaches      Blood pressure 127/67, pulse 89, temperature 98.1 F (36.7 C), temperature source Oral, resp. rate 18, last menstrual period 05/02/2018. General appearance: alert, cooperative and mild distress Lungs: normal respiratory effort Heart: regular rate and rhythm Abdomen: soft, non-tender; bowel sounds normal Extremities: Homans sign is negative, no sign of DVT DTR's 2+ Presentation: cephalic Fetal monitoring  Baseline: 145 bpm, Variability: Good {> 6 bpm), Accelerations: Reactive and Decelerations: Absent Uterine activity  Irregular. Received MS and phenergan for therapeutic rest d/t ctx being 7 minutes apart; pt did not rest, still contracting irregularly Dilation: 5 Effacement (%): 100 Station: -2 Exam by:: jolynn   Prenatal labs: ABO, Rh: B/Positive/-- (11/22 1153) Antibody: Negative (11/22  1153) Rubella: 2.07 (11/22 1153) RPR: Non Reactive (04/02 0856)  HBsAg: Negative (11/22 1153)  HIV: Non Reactive (04/02 0856)      Nursing Staff Provider  Office Location  [place] Dating  LMP   Language  English Anatomy US  Normal female  Flu Vaccine  Declined Genetic Screen  NIPS:   Normal Female  AFP:    Drawn 1/23- Negative    TDaP vaccine  11/19/2018 Hgb A1C or  GTT  Third trimester WNL Glucose, Fasting 65 - 91 mg/dL 85   Glucose, 1 hour 65 - 179 mg/dL 121   Glucose, 2 hour 65 - 152 mg/dL 102     Rhogam  n/a   LAB RESULTS   Feeding Plan br/bo Blood Type B/Positive/-- (11/22 1153)   Contraception Nexplanon Antibody Negative (11/22 1153)  Circumcision Info given  Rubella 2.07 (11/22 1153)  Pediatrician  List given RPR Non Reactive (11/22 1153)   Support Person Da[place]kuan HBsAg Negative (11/22 1153)   Prenatal Classes List given HIV Non Reactive (11/22 1153)  BTL Consent N/A GBS  neg   VBAC Consent N/A Pap 07/15/18 Normal     Hgb Electro  Negative     CF Negative for 32 mutations analyzed    SMA 2    Waterbirth  [ ]  Class [ ]  Consent [ ]  CNM visit     Prenatal Transfer Tool  Maternal Diabetes: No Genetic Screening: Normal Maternal Ultrasounds/Referrals: Normal Fetal Ultrasounds or other Referrals:  None Maternal Substance Abuse:  No Significant Maternal Medications:  None Significant Maternal Lab Results: Lab values include: Group B Strep negative     No results found for this or any previous visit (from the past 24 hour(s)).  Assessment: Jennifer Sanchez is a 23 y.o. G1P0 with an IUP at [redacted]w[redacted]d presenting for labor  Plan: #Labor: expectant management #Pain:  Per request #FWB Cat 1    Christin Fudge 01/28/2019, 11:30 AM

## 2019-01-28 NOTE — MAU Note (Signed)
Pt very verbal with ctx's.

## 2019-01-28 NOTE — Progress Notes (Addendum)
Comfortable w/epidural.  FHR Cat 1.  Ctx q 4-6 minutes AROM w/clear fluid.  cx 5.6/100/-2.  Continue to monitor.

## 2019-01-29 DIAGNOSIS — Z30017 Encounter for initial prescription of implantable subdermal contraceptive: Secondary | ICD-10-CM

## 2019-01-29 LAB — TYPE AND SCREEN
ABO/RH(D): B POS
Antibody Screen: NEGATIVE

## 2019-01-29 LAB — RPR: RPR Ser Ql: NONREACTIVE

## 2019-01-29 MED ORDER — OXYCODONE HCL 5 MG PO TABS
5.0000 mg | ORAL_TABLET | ORAL | Status: AC | PRN
  Administered 2019-01-29 (×2): 5 mg via ORAL
  Filled 2019-01-29 (×2): qty 1

## 2019-01-29 NOTE — Progress Notes (Signed)
Post Partum Day 1 Subjective: no complaints, up ad lib, voiding and tolerating PO, small lochia, plans to breastfeed, plans Nexplanon today  Objective: Blood pressure 104/61, pulse 65, temperature 98.2 F (36.8 C), temperature source Oral, resp. rate 18, height 5\' 6"  (1.676 m), weight 88.9 kg, last menstrual period 05/02/2018, SpO2 98 %, unknown if currently breastfeeding.  Physical Exam:  General: alert, cooperative and no distress Lochia:normal flow Chest: CTAB Heart: RRR no m/r/g Abdomen: +BS, soft, nontender,  Uterine Fundus: firm DVT Evaluation: No evidence of DVT seen on physical exam. Extremities: no edema  Recent Labs    01/28/19 1153  HGB 12.4  HCT 37.1    Assessment/Plan: Plan for discharge tomorrow   LOS: 1 day   Christin Fudge 01/29/2019, 8:05 AM

## 2019-01-29 NOTE — Procedures (Signed)
Skie Harris-Sabra 23 y.o. Vitals:   01/29/19 0122 01/29/19 0512  BP: 112/66 104/61  Pulse: 66 65  Resp: 16 18    Temp: 98.1 F (36.7 C) 98.2 F (36.8 C)  SpO2:      Past Medical History:  Diagnosis Date  . Medical history non-contributory     Family History  Problem Relation Age of Onset  . Lupus Mother   . Atrial fibrillation Mother   . Endometriosis Sister   . Hypertension Sister     Social History   Socioeconomic History  . Marital status: Single    Spouse name: Not on file  . Number of children: Not on file  . Years of education: 44  . Highest education level: Some college, no degree  Occupational History  . Not on file  Social Needs  . Financial resource strain: Not on file  . Food insecurity    Worry: Not on file    Inability: Not on file  . Transportation needs    Medical: Not on file    Non-medical: Not on file  Tobacco Use  . Smoking status: Never Smoker  . Smokeless tobacco: Never Used  Substance and Sexual Activity  . Alcohol use: No  . Drug use: No  . Sexual activity: Yes    Birth control/protection: None  Lifestyle  . Physical activity    Days per week: Not on file    Minutes per session: Not on file  . Stress: Not on file  Relationships  . Social Herbalist on phone: Not on file    Gets together: Not on file    Attends religious service: Not on file    Active member of club or organization: Not on file    Attends meetings of clubs or organizations: Not on file    Relationship status: Not on file  . Intimate partner violence    Fear of current or ex partner: Not on file    Emotionally abused: Not on file    Physically abused: Not on file    Forced sexual activity: Not on file  Other Topics Concern  . Not on file  Social History Narrative  . Not on file      HPI:  Antania Hoefling is being seen for Nexplanon insertion.  She was given informed consent for this and a signed copy is in the chart.  Appropriate  time out taken. Nexlanon site (R arm) identified and the area was prepped in usual sterile fashon. 2 cc of 1% lidocaine was used to anesthetize the area and the Nexplanon was inserted without difficulty.  A 2x2 gauze and a pressure bandage were applied.  Pt was instructed to remove pressure bandage in a few hours, and keep insertion site covered with a bandaid for 3 days.  Follow-up scheduled PRN problems  Carolyn Stare 01/29/2019 12:31 PM

## 2019-01-29 NOTE — Anesthesia Postprocedure Evaluation (Signed)
Anesthesia Post Note  Patient: Jennifer Sanchez  Procedure(s) Performed: AN AD [place]     Patient location during evaluation: Mother Baby Anesthesia Type: Epidural Level of consciousness: awake and alert and oriented Pain management: satisfactory to patient Vital Signs Assessment: post-procedure vital signs reviewed and stable Respiratory status: spontaneous breathing and nonlabored ventilation Cardiovascular status: stable Postop Assessment: no headache, no backache, no signs of nausea or vomiting, adequate PO intake, patient able to bend at knees and able to ambulate (patient up walking) Anesthetic complications: no    Last Vitals:  Vitals:   01/29/19 0122 01/29/19 0512  BP: 112/66 104/61  Pulse: 66 65  Resp: 16 18  Temp: 36.7 C 36.8 C  SpO2:      Last Pain:  Vitals:   01/29/19 0659  TempSrc:   PainSc: 6    Pain Goal: Patients Stated Pain Goal: 2 (01/28/19 0805)                 [name]

## 2019-01-29 NOTE — Lactation Note (Signed)
This note was copied from a baby's chart. Lactation Consultation Note  Patient Name: Jennifer Sanchez [id number] Date: 01/29/2019 Reason for consult: Follow-up assessment;1st time breastfeeding;Primapara;Early term 37-38.6wks;Other (Comment)(per mom the baby recently fed and latched x 2)  Baby is 62 hours old  As LC entered the room baby is asleep in the bedside crib and mom resting in bed.  Per mom the baby recently fed and the LC updated the doc flow sheet per mom.  Per mom feels the baby is opening his mouth wider and latching better/ more swallows.  Mom mentioned doesn't have a pump at home and will need a hand pump prior to discharge.   LC reviewed the doc flow sheets  2 % weight loss , voids and stools QS for age.  Breast / formula. Breast feeding 15 - 30 mins - Latch score 6. And supplementing x 3 10 -20 mins    Maternal Data Has patient been taught Hand Expression?: (LC enc mom piror to latching breast massage, hand express)  Feeding Feeding Type: Breast Fed  LATCH Score                   Interventions Interventions: Breast feeding basics reviewed  Lactation Tools Discussed/Used     Consult Status Consult Status: Follow-up Date: 01/30/19 Follow-up type: In-patient    [place] 01/29/2019, 4:35 PM

## 2019-01-30 ENCOUNTER — Other Ambulatory Visit: Payer: Self-pay

## 2019-01-30 MED ORDER — IBUPROFEN 600 MG PO TABS: 600.0000 mg | ORAL_TABLET | Freq: Four times a day (QID) | ORAL | 0 refills | Status: AC | PRN

## 2019-01-30 NOTE — Discharge Instructions (Signed)
Vaginal Delivery, Care After °Refer to this sheet in the next few weeks. These instructions provide you with information about caring for yourself after vaginal delivery. Your health care provider may also give you more specific instructions. Your treatment has been planned according to current medical practices, but problems sometimes occur. Call your health care provider if you have any problems or questions. °What can I expect after the procedure? °After vaginal delivery, it is common to have: °· Some bleeding from your vagina. °· Soreness in your abdomen, your vagina, and the area of skin between your vaginal opening and your anus (perineum). °· Pelvic cramps. °· Fatigue. °Follow these instructions at home: °Medicines °· Take over-the-counter and prescription medicines only as told by your health care provider. °· If you were prescribed an antibiotic medicine, take it as told by your health care provider. Do not stop taking the antibiotic until it is finished. °Driving ° °· Do not drive or operate heavy machinery while taking prescription pain medicine. °· Do not drive for 24 hours if you received a sedative. °Lifestyle °· Do not drink alcohol. This is especially important if you are breastfeeding or taking medicine to relieve pain. °· Do not use tobacco products, including cigarettes, chewing tobacco, or e-cigarettes. If you need help quitting, ask your health care provider. °Eating and drinking °· Drink at least 8 eight-ounce glasses of water every day unless you are told not to by your health care provider. If you choose to breastfeed your baby, you may need to drink more water than this. °· Eat high-fiber foods every day. These foods may help prevent or relieve constipation. High-fiber foods include: °? Whole grain cereals and breads. °? Brown rice. °? Beans. °? Fresh fruits and vegetables. °Activity °· Return to your normal activities as told by your health care provider. Ask your health care provider what  activities are safe for you. °· Rest as much as possible. Try to rest or take a nap when your baby is sleeping. °· Do not lift anything that is heavier than your baby or 10 lb (4.5 kg) until your health care provider says that it is safe. °· Talk with your health care provider about when you can engage in sexual activity. This may depend on your: °? Risk of infection. °? Rate of healing. °? Comfort and desire to engage in sexual activity. °Vaginal Care °· If you have an episiotomy or a vaginal tear, check the area every day for signs of infection. Check for: °? More redness, swelling, or pain. °? More fluid or blood. °? Warmth. °? Pus or a bad smell. °· Do not use tampons or douches until your health care provider says this is safe. °· Watch for any blood clots that may pass from your vagina. These may look like clumps of dark red, brown, or black discharge. °General instructions °· Keep your perineum clean and dry as told by your health care provider. °· Wear loose, comfortable clothing. °· Wipe from front to back when you use the toilet. °· Ask your health care provider if you can shower or take a bath. If you had an episiotomy or a perineal tear during labor and delivery, your health care provider may tell you not to take baths for a certain length of time. °· Wear a bra that supports your breasts and fits you well. °· If possible, have someone help you with household activities and help care for your baby for at least a few days after you   leave the hospital. °· Keep all follow-up visits for you and your baby as told by your health care provider. This is important. °Contact a health care provider if: °· You have: °? Vaginal discharge that has a bad smell. °? Difficulty urinating. °? Pain when urinating. °? A sudden increase or decrease in the frequency of your bowel movements. °? More redness, swelling, or pain around your episiotomy or vaginal tear. °? More fluid or blood coming from your episiotomy or vaginal  tear. °? Pus or a bad smell coming from your episiotomy or vaginal tear. °? A fever. °? A rash. °? Little or no interest in activities you used to enjoy. °? Questions about caring for yourself or your baby. °· Your episiotomy or vaginal tear feels warm to the touch. °· Your episiotomy or vaginal tear is separating or does not appear to be healing. °· Your breasts are painful, hard, or turn red. °· You feel unusually sad or worried. °· You feel nauseous or you vomit. °· You pass large blood clots from your vagina. If you pass a blood clot from your vagina, save it to show to your health care provider. Do not flush blood clots down the toilet without having your health care provider look at them. °· You urinate more than usual. °· You are dizzy or light-headed. °· You have not breastfed at all and you have not had a menstrual period for 12 weeks after delivery. °· You have stopped breastfeeding and you have not had a menstrual period for 12 weeks after you stopped breastfeeding. °Get help right away if: °· You have: °? Pain that does not go away or does not get better with medicine. °? Chest pain. °? Difficulty breathing. °? Blurred vision or spots in your vision. °? Thoughts about hurting yourself or your baby. °· You develop pain in your abdomen or in one of your legs. °· You develop a severe headache. °· You faint. °· You bleed from your vagina so much that you fill two sanitary pads in one hour. °This information is not intended to replace advice given to you by your health care provider. Make sure you discuss any questions you have with your health care provider. °Document Released: 08/02/2000 Document Revised: 01/17/2016 Document Reviewed: 08/20/2015 °Elsevier Interactive Patient Education © 2019 Elsevier Inc. ° °

## 2019-01-30 NOTE — Lactation Note (Signed)
This note was copied from a baby's chart. Lactation Consultation Note  Patient Name: Jennifer Sanchez [id number] Date: 01/30/2019 Reason for consult: Follow-up assessment Baby is 39 hours/3% weight loss.  Mom states she puts baby to breast with cues and baby latches easily.  Baby acts fussy and hungry after breastfeeding so mom giving formula supplementation.  Discussed milk coming to volume and the prevention and treatment of engorgement.  Manual pump given with instructions.  Questions answered.  Reviewed outpatient services and encouraged to call prn.  Maternal Data    Feeding    LATCH Score                   Interventions Interventions: Hand pump  Lactation Tools Discussed/Used     Consult Status Consult Status: Complete Follow-up type: Call as needed    Ave Filter 01/30/2019, 9:13 AM

## 2019-02-01 ENCOUNTER — Encounter: Payer: Medicaid Other | Admitting: Obstetrics & Gynecology

## 2019-02-01 ENCOUNTER — Telehealth: Payer: Self-pay | Admitting: Obstetrics and Gynecology

## 2019-02-01 NOTE — Telephone Encounter (Signed)
Patient called to say she only wanted to see Noni Saupe. Informed her of the MyChart video visit. She stated she does have the app already.

## 2019-02-25 ENCOUNTER — Telehealth: Payer: Medicaid Other | Admitting: Internal Medicine

## 2019-03-02 ENCOUNTER — Other Ambulatory Visit: Payer: Self-pay

## 2019-03-02 ENCOUNTER — Telehealth (INDEPENDENT_AMBULATORY_CARE_PROVIDER_SITE_OTHER): Payer: Medicaid Other | Admitting: Obstetrics and Gynecology

## 2019-03-02 NOTE — Progress Notes (Signed)
TELEHEALTH VIRTUAL POSTPARTUM VISIT ENCOUNTER NOTE  I connected with@ on 03/02/19 at  2:15 PM EDT by telephone at home and verified that I am speaking with the correct person using two identifiers.   I discussed the limitations, risks, security and privacy concerns of performing an evaluation and management service by telephone and the availability of in person appointments. I also discussed with the patient that there may be a patient responsible charge related to this service. The patient expressed understanding and agreed to proceed.  Appointment Date: 03/02/2019  OBGYN Clinic: [facility]   Chief Complaint:  Postpartum Visit  History of Present Illness: Jennifer Sanchez is a 23 y.o. African-American G1P1001 (Patient's last menstrual period was 05/02/2018 (approximate).), seen for the above chief complaint. Her past medical history is significant for None  She is s/p vaginal delivery on 01/28/2019 at 38 weeks; she was discharged to home on 01/30/2019. Pregnancy complicated by None. Baby is doing well very well.  Complains of None  Vaginal bleeding or discharge: Yes  Mode of feeding infant: Bottle Intercourse: No  Contraception: Nexplanon  PP depression s/s: No .  Any bowel or bladder issues: No  Pap smear: no abnormalities (date: 06/2018)  Review of Systems: Her 12 point review of systems is negative or as noted in the History of Present Illness.  Patient Active Problem List   Diagnosis Date Noted  . Normal labor and delivery 01/29/2019  . Counseling for initiation of birth control method 01/21/2019  . Supervision of low-risk pregnancy 07/10/2018    Medications Scarlet Cronin had no medications administered during this visit. Current Outpatient Medications  Medication Sig Dispense Refill  . Prenatal Vit-Fe Fumarate-FA (MULTIVITAMIN-PRENATAL) 27-0.8 MG TABS tablet Take 1 tablet by mouth daily at 12 noon.    Marland Kitchen ibuprofen (ADVIL) 600 MG tablet Take 1 tablet (600 mg total)  by mouth every 6 (six) hours as needed. (Patient not taking: Reported on 03/02/2019) 30 tablet 0   No current facility-administered medications for this visit.     Allergies Aspirin, Penicillins, Betadine [povidone iodine], Iodine, and Shellfish allergy  Physical Exam:  General:  Alert, oriented and cooperative.   Mental Status: Normal mood and affect perceived. Normal judgment and thought content.  Rest of physical exam deferred due to type of encounter  PP Depression Screening:   Edinburgh Postnatal Depression Scale - 03/02/19 1409      Edinburgh Postnatal Depression Scale:  In the Past 7 Days   I have been able to laugh and see the funny side of things.  0    I have looked forward with enjoyment to things.  0    I have blamed myself unnecessarily when things went wrong.  0    I have been anxious or worried for no good reason.  0    I have felt scared or panicky for no good reason.  0    Things have been getting on top of me.  0    I have been so unhappy that I have had difficulty sleeping.  0    I have felt sad or miserable.  0    I have been so unhappy that I have been crying.  0    The thought of harming myself has occurred to me.  0    Edinburgh Postnatal Depression Scale Total  0       Assessment:Patient is a 23 y.o. G1P1001 who is 4 weeks postpartum from a SVD.  She is doing well.  Plan:  Neplanon in place Annual due in Nov 2020 Patient out of town and does not have her BP cuff. Will take it when she returns and message the office.   RTC as needed  I discussed the assessment and treatment plan with the patient. The patient was provided an opportunity to ask questions and all were answered. The patient agreed with the plan and demonstrated an understanding of the instructions.   The patient was advised to call back or seek an in-person evaluation/go to the ED for any concerning postpartum symptoms.  I provided 13 minutes of non-face-to-face time during this  encounter.   Venia CarbonJennifer Marieke Lubke, NP [facility]

## 2019-03-31 ENCOUNTER — Ambulatory Visit
Admission: EM | Admit: 2019-03-31 | Discharge: 2019-03-31 | Disposition: A | Discharge status: Home / Self Care | Payer: Medicaid Other | Attending: Physician Assistant | Admitting: Physician Assistant

## 2019-03-31 DIAGNOSIS — N898 Other specified noninflammatory disorders of vagina: Secondary | ICD-10-CM | POA: Diagnosis not present

## 2019-03-31 MED ORDER — METRONIDAZOLE 0.75 % VA GEL: 1.0000 | Freq: Every day | VAGINAL | 0 refills | Status: AC

## 2019-03-31 NOTE — ED Provider Notes (Signed)
[facility]    CSN: [id number] Arrival date & time: 03/31/19  1450     History   Chief Complaint Chief Complaint  Patient presents with  . Vaginal Discharge    HPI Jennifer Sanchez is a 23 y.o. female.   23 year old female 2 months postpartum comes in for few day history of vaginal discharge with odor. States she stopped bleeding vaginally about 2 weeks ago and usually gets BV after long periods of spotting/bleeding. Has not been sexually active since giving birth. Denies abdominal pain, nausea, vomiting. Denies fever, chills, night sweats. Denies urinary symptoms such as frequency, dysuria, hematuria. Denies vaginal itching, changes in hygiene product. Currently breastfeeding.      Past Medical History:  Diagnosis Date  . Medical history non-contributory     Patient Active Problem List   Diagnosis Date Noted  . Postpartum care and examination 03/02/2019  . Normal labor and delivery 01/29/2019  . Counseling for initiation of birth control method 01/21/2019  . Supervision of low-risk pregnancy 07/10/2018    Past Surgical History:  Procedure Laterality Date  . NO PAST SURGERIES    . WISDOM TOOTH EXTRACTION  2015    OB History    Gravida  1   Para  1   Term  1   Preterm      AB      Living  1     SAB      TAB      Ectopic      Multiple  0   Live Births  1            Home Medications    Prior to Admission medications   Medication Sig Start Date End Date Taking? Authorizing Provider  ibuprofen (ADVIL) 600 MG tablet Take 1 tablet (600 mg total) by mouth every 6 (six) hours as needed. Patient not taking: Reported on 03/02/2019 01/30/19   Arabella MerlesShaw, Kimberly D, CNM  metroNIDAZOLE (METROGEL VAGINAL) 0.75 % vaginal gel Place 1 Applicatorful vaginally at bedtime for 5 days. 03/31/19 04/05/19  Belinda FisherYu, Abubakr Wieman V, PA-C  Prenatal Vit-Fe Fumarate-FA (MULTIVITAMIN-PRENATAL) 27-0.8 MG TABS tablet Take 1 tablet by mouth daily at 12 noon.    [provider]    Family History Family History  Problem Relation Age of Onset  . Lupus Mother   . Atrial fibrillation Mother   . Endometriosis Sister   . Hypertension Sister     Social History Social History   Tobacco Use  . Smoking status: Never Smoker  . Smokeless tobacco: Never Used  Substance Use Topics  . Alcohol use: No  . Drug use: No     Allergies   Aspirin, Penicillins, Betadine [povidone iodine], Iodine, and Shellfish allergy   Review of Systems Review of Systems  Reason unable to perform ROS: See HPI as above.     Physical Exam Triage Vital Signs ED Triage Vitals [03/31/19 1503]  Enc Vitals Group     BP 139/79     Pulse Rate 82     Resp 18     Temp 99.1 F (37.3 C)     Temp Source Oral     SpO2 95 %     Weight      Height      Head Circumference      Peak Flow      Pain Score 0     Pain Loc      Pain Edu?  Excl. in [place]?    No data found.  Updated Vital Signs BP 139/79 (BP Location: Left Arm)   Pulse 82   Temp 99.1 F (37.3 C) (Oral)   Resp 18   LMP 05/02/2018 (Approximate)   SpO2 95%   Physical Exam Constitutional:      General: She is not in acute distress.    Appearance: She is well-developed. She is not ill-appearing, toxic-appearing or diaphoretic.  HENT:     Head: Normocephalic and atraumatic.  Eyes:     Conjunctiva/sclera: Conjunctivae normal.     Pupils: Pupils are equal, round, and reactive to light.  Cardiovascular:     Rate and Rhythm: Normal rate and regular rhythm.     Heart sounds: Normal heart sounds. No murmur. No friction rub. No gallop.   Pulmonary:     Effort: Pulmonary effort is normal.     Breath sounds: Normal breath sounds. No wheezing or rales.  Abdominal:     General: Bowel sounds are normal.     Palpations: Abdomen is soft.     Tenderness: There is no abdominal tenderness. There is no right CVA tenderness, left CVA tenderness, guarding or rebound.  Skin:    General: Skin is warm and dry.   Neurological:     Mental Status: She is alert and oriented to person, place, and time.  Psychiatric:        Behavior: Behavior normal.        Judgment: Judgment normal.      UC Treatments / Results  Labs (all labs ordered are listed, but only abnormal results are displayed) Labs Reviewed - No data to display  EKG   Radiology No results found.  Procedures Procedures (including critical care time)  Medications Ordered in UC Medications - No data to display  Initial Impression / Assessment and Plan / UC Course  I have reviewed the triage vital signs and the nursing notes.  Pertinent labs & imaging results that were available during my care of the patient were reviewed by me and considered in my medical decision making (see chart for details).    Will have patient start metrogel for possible BV. Discussed to also get clearance from OBGYN given patient is breastfeeding. Cytology deferred as patient is not worried for STDs. Return precautions given. Patient expresses understanding and agrees to plan.  Final Clinical Impressions(s) / UC Diagnoses   Final diagnoses:  Vaginal discharge   ED Prescriptions    Medication Sig Dispense Auth. Provider   metroNIDAZOLE (METROGEL VAGINAL) 0.75 % vaginal gel Place 1 Applicatorful vaginally at bedtime for 5 days. 50 g Tobin Chad, [place] 03/31/19 1528

## 2019-03-31 NOTE — ED Triage Notes (Signed)
Pt c/o white vaginal discharge with a slight odor. States had a baby 52months ago, stopped bleeding 2wks ago and hasn't had intercourse since.

## 2019-03-31 NOTE — Discharge Instructions (Signed)
You were treated empirically for BV with metrogel. Refrain from sexual activity for the next 7 days. Monitor for any worsening of symptoms, fever, abdominal pain, nausea, vomiting, to follow up for reevaluation.

## 2019-08-01 IMAGING — US US MFM OB FOLLOW UP
1 series · 14 of 28 positions shown · non-contrast
Comparison: none

[Series 1: us mfm ob follow up · 14 of 35 slices shown]
[im 2/35]
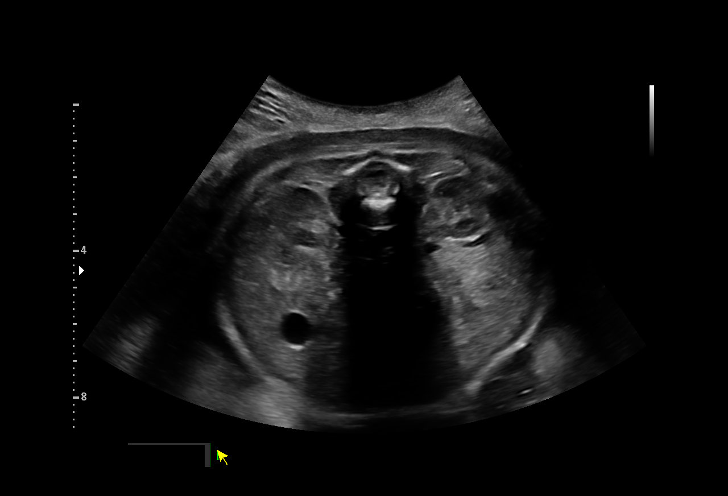
[im 4/35]
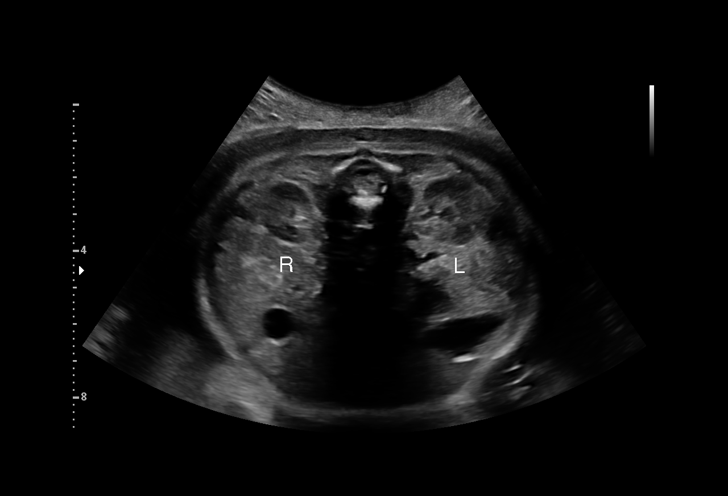
[im 7/35]
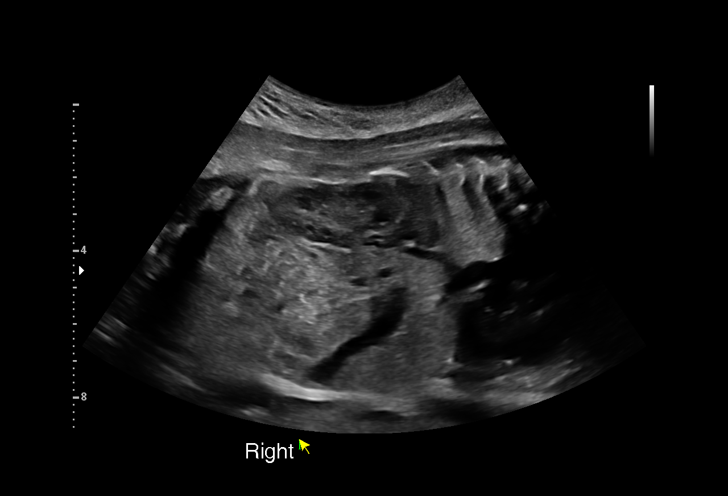
[im 9/35]
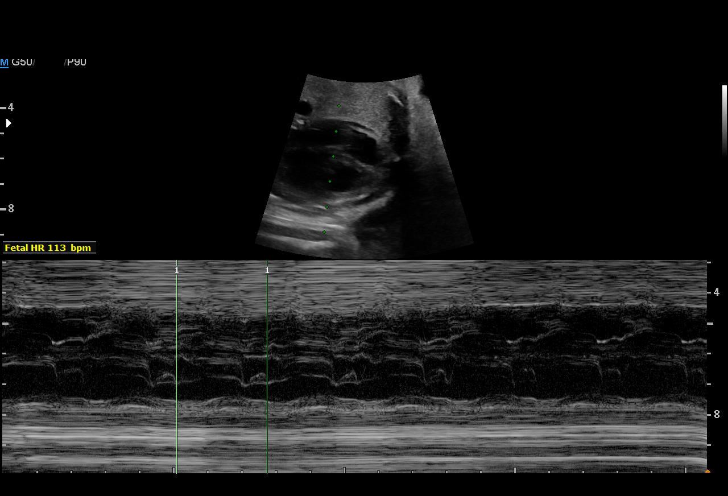
[im 12/35]
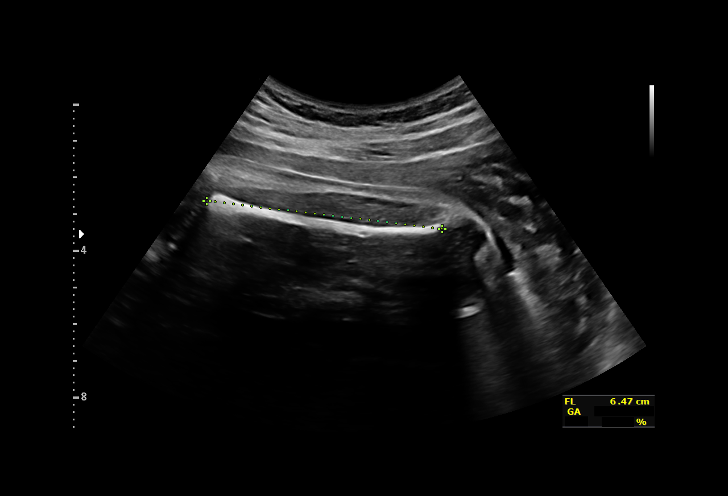
[im 14/35]
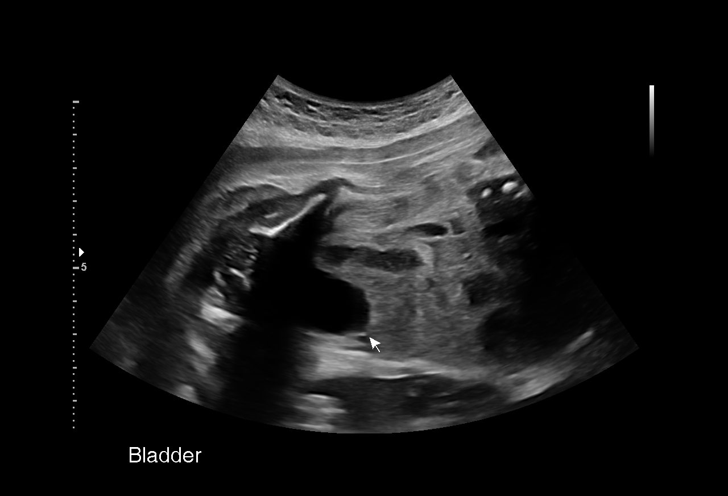
[im 17/35]
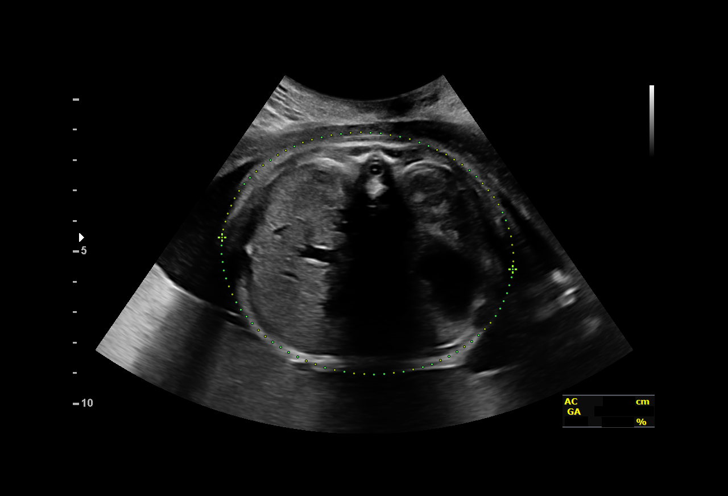
[im 19/35]
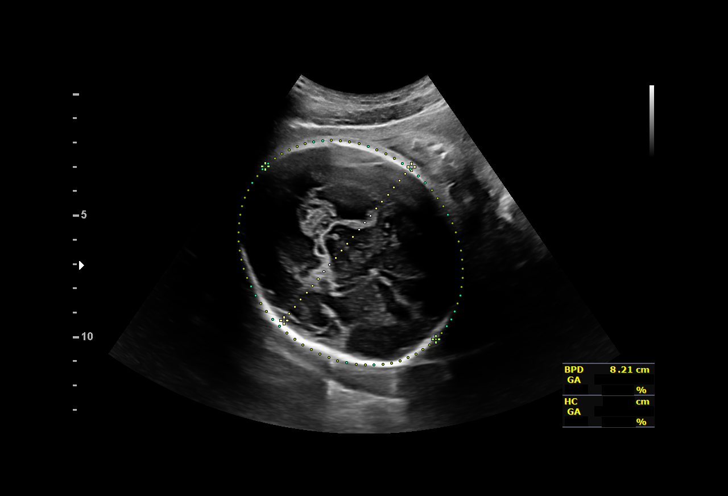
[im 22/35]
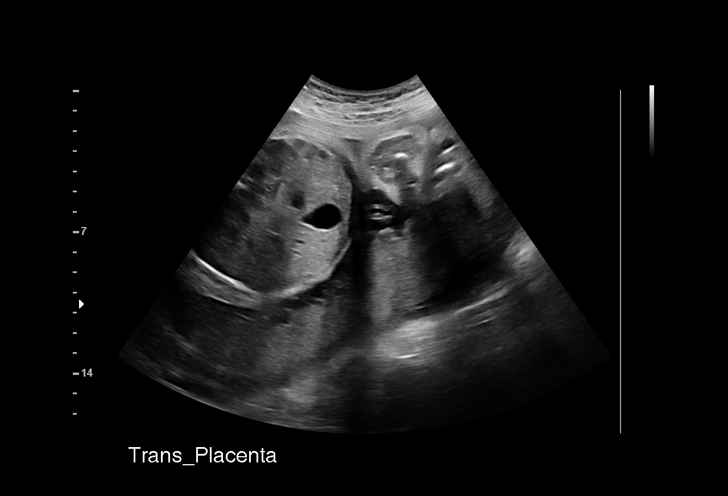
[im 24/35]
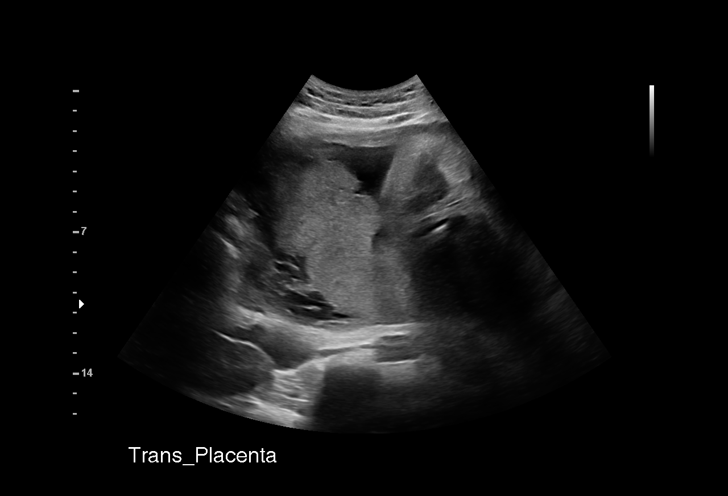
[im 27/35]
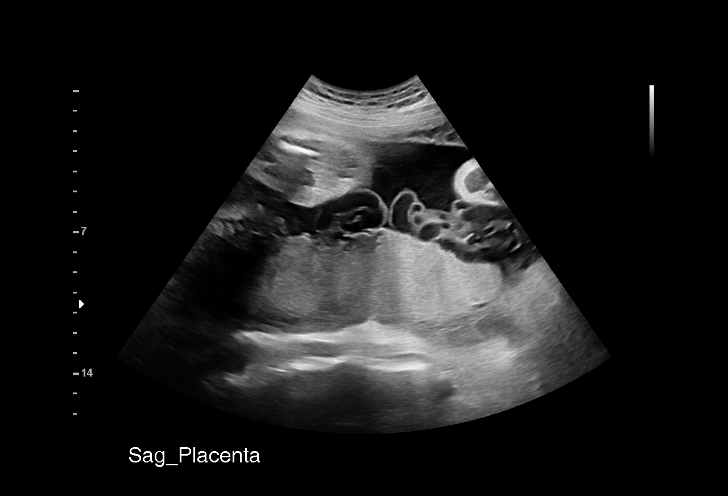
[im 29/35]
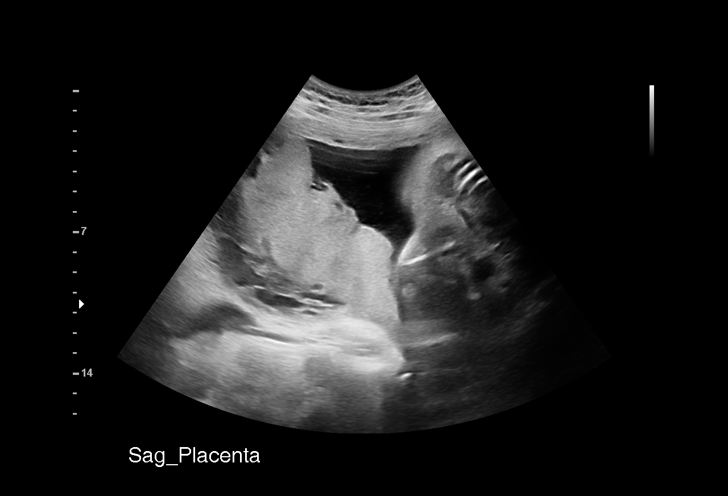
[im 32/35]
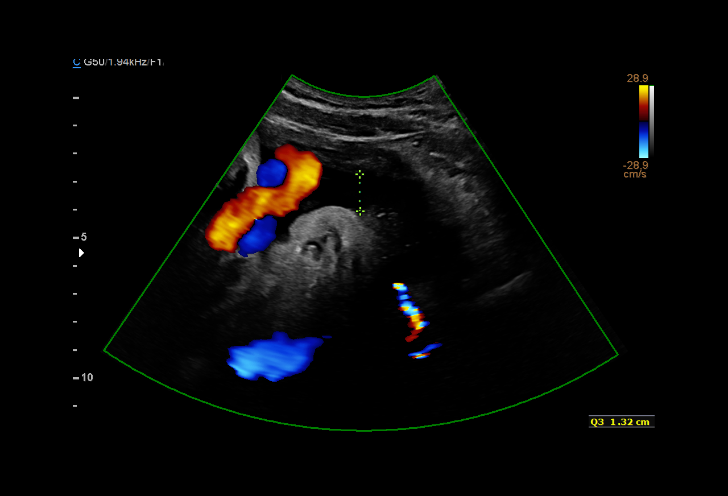
[im 35/35]
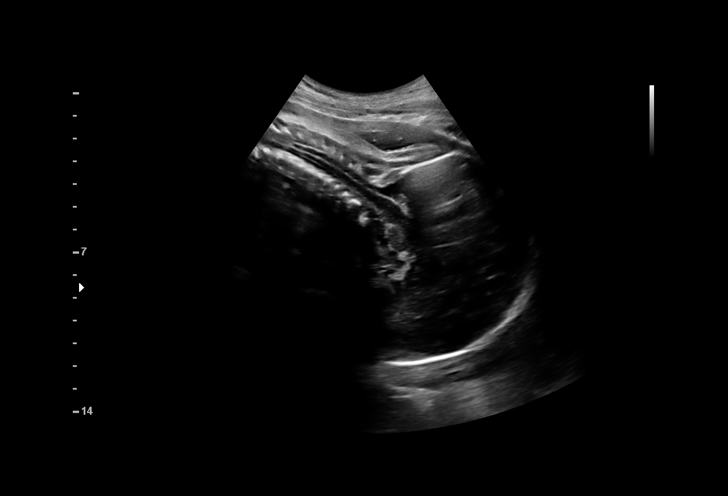

[14 of 28 positions shown; findings below may reference images not displayed]

[address]

 ----------------------------------------------------------------------

 ----------------------------------------------------------------------
Indications

  Encounter for other antenatal screening
  follow-up
  31 weeks gestation of pregnancy
 ----------------------------------------------------------------------
Vital Signs

 BMI:
Fetal Evaluation

 Num Of Fetuses:         1
 Fetal Heart Rate(bpm):  113
 Cardiac Activity:       Observed
 Presentation:           Cephalic
 Placenta:               Posterior
 P. Cord Insertion:      Previously Visualized

 Amniotic Fluid
 AFI FV:      Within normal limits

 AFI Sum(cm)     %Tile       Largest Pocket(cm)
 10.54           19

 RUQ(cm)       RLQ(cm)       LUQ(cm)        LLQ(cm)

Biometry

 BPD:      83.1  mm     G. Age:  33w 3d         92  %    CI:        79.98   %    70 - 86
                                                         FL/HC:      22.0   %    19.3 -
 HC:      293.6  mm     G. Age:  32w 3d         44  %    HC/AC:      1.06        0.96 -
 AC:       276   mm     G. Age:  31w 5d         58  %    FL/BPD:     77.6   %    71 - 87
 FL:       64.5  mm     G. Age:  33w 2d         86  %    FL/AC:      23.4   %    20 - 24
 Est. FW:    6946  gm      4 lb 5 oz     76  %
OB History

 Gravidity:    1         Term:   0        Prem:   0        SAB:   0
 TOP:          0       Ectopic:  0        Living: 0
Gestational Age

 LMP:           31w 2d        Date:  05/02/18                 EDD:   02/06/19
 U/S Today:     32w 5d                                        EDD:   01/27/19
 Best:          31w 2d     Det. By:  Early Ultrasound         EDD:   02/06/19
                                     (06/16/18)
Anatomy

 Cranium:               Appears normal         Aortic Arch:            Previously seen
 Cavum:                 Previously seen        Ductal Arch:            Previously seen
 Ventricles:            Previously seen        Diaphragm:              Previously seen
 Choroid Plexus:        Previously seen        Stomach:                Appears normal, left
                                                                       sided
 Cerebellum:            Previously seen        Abdomen:                Previously seen
 Posterior Fossa:       Previously seen        Abdominal Wall:         Previously seen
 Nuchal Fold:           Previously seen        Cord Vessels:           Previously seen
 Face:                  Orbits and profile     Kidneys:                Appear normal
                        previously seen
 Lips:                  Previously seen        Bladder:                Appears normal
 Thoracic:              Appears normal         Spine:                  Previously seen
 Heart:                 Previously seen        Upper Extremities:      Previously seen
 RVOT:                  Previously seen        Lower Extremities:      Previously seen
 LVOT:                  Previously seen

 Other:  Fetus appears to be a male. Heels and 5th digit prev visualized.
         Nasal bone prev visualized. 3VV prev visualized. Technically difficult
         due to fetal position.
Cervix Uterus Adnexa

 Cervix
 Not visualized (advanced GA >41wks)
Impression

 Fetal growth is appropriate for gestational age. Amniotic fluid
 is normal and good fetal activity is seen. Both kidneys are
 seen.
 We reassured the patient of the findings.
Recommendations

 Follow-up as clinically indicated.
                 Vallecer, Tze Leung
